# Patient Record
Sex: Male | Born: 1990 | Race: White | Hispanic: No | Marital: Single | Smoking: Current every day smoker
Health system: Southern US, Community
[De-identification: ages and names within clinical notes are randomized; demographics above are authoritative.]

## PROBLEM LIST (undated history)

## (undated) DIAGNOSIS — E785 Hyperlipidemia, unspecified: Secondary | ICD-10-CM

## (undated) DIAGNOSIS — M549 Dorsalgia, unspecified: Secondary | ICD-10-CM

## (undated) DIAGNOSIS — N201 Calculus of ureter: Secondary | ICD-10-CM

## (undated) HISTORY — PX: SHOULDER SURGERY: SHX246

## (undated) HISTORY — PX: TONSILLECTOMY: SUR1361

## (undated) HISTORY — PX: ADENOIDECTOMY: SUR15

---

## 2009-01-13 ENCOUNTER — Emergency Department (HOSPITAL_BASED_OUTPATIENT_CLINIC_OR_DEPARTMENT_OTHER): Admission: EM | Admit: 2009-01-13 | Discharge: 2009-01-14 | Payer: Self-pay | Admitting: Emergency Medicine

## 2009-01-26 ENCOUNTER — Emergency Department (HOSPITAL_BASED_OUTPATIENT_CLINIC_OR_DEPARTMENT_OTHER): Admission: EM | Admit: 2009-01-26 | Discharge: 2009-01-26 | Payer: Self-pay | Admitting: Emergency Medicine

## 2009-02-05 ENCOUNTER — Emergency Department (HOSPITAL_BASED_OUTPATIENT_CLINIC_OR_DEPARTMENT_OTHER): Admission: EM | Admit: 2009-02-05 | Discharge: 2009-02-05 | Payer: Self-pay | Admitting: Emergency Medicine

## 2009-02-10 ENCOUNTER — Emergency Department (HOSPITAL_BASED_OUTPATIENT_CLINIC_OR_DEPARTMENT_OTHER): Admission: EM | Admit: 2009-02-10 | Discharge: 2009-02-10 | Payer: Self-pay | Admitting: Emergency Medicine

## 2009-02-25 ENCOUNTER — Emergency Department (HOSPITAL_BASED_OUTPATIENT_CLINIC_OR_DEPARTMENT_OTHER): Admission: EM | Admit: 2009-02-25 | Discharge: 2009-02-25 | Payer: Self-pay | Admitting: Emergency Medicine

## 2009-03-24 ENCOUNTER — Emergency Department (HOSPITAL_BASED_OUTPATIENT_CLINIC_OR_DEPARTMENT_OTHER): Admission: EM | Admit: 2009-03-24 | Discharge: 2009-03-24 | Payer: Self-pay | Admitting: Emergency Medicine

## 2009-08-13 ENCOUNTER — Emergency Department (HOSPITAL_BASED_OUTPATIENT_CLINIC_OR_DEPARTMENT_OTHER): Admission: EM | Admit: 2009-08-13 | Discharge: 2009-08-14 | Payer: Self-pay | Admitting: Emergency Medicine

## 2009-08-14 ENCOUNTER — Ambulatory Visit: Payer: Self-pay | Admitting: Diagnostic Radiology

## 2009-09-08 ENCOUNTER — Emergency Department (HOSPITAL_BASED_OUTPATIENT_CLINIC_OR_DEPARTMENT_OTHER): Admission: EM | Admit: 2009-09-08 | Discharge: 2009-09-08 | Payer: Self-pay | Admitting: Emergency Medicine

## 2009-09-08 ENCOUNTER — Ambulatory Visit: Payer: Self-pay | Admitting: Diagnostic Radiology

## 2009-09-18 ENCOUNTER — Ambulatory Visit: Payer: Self-pay | Admitting: Diagnostic Radiology

## 2009-09-18 ENCOUNTER — Emergency Department (HOSPITAL_BASED_OUTPATIENT_CLINIC_OR_DEPARTMENT_OTHER): Admission: EM | Admit: 2009-09-18 | Discharge: 2009-09-18 | Payer: Self-pay | Admitting: Emergency Medicine

## 2009-12-04 ENCOUNTER — Ambulatory Visit: Payer: Self-pay | Admitting: Radiology

## 2009-12-04 ENCOUNTER — Emergency Department (HOSPITAL_BASED_OUTPATIENT_CLINIC_OR_DEPARTMENT_OTHER): Admission: EM | Admit: 2009-12-04 | Discharge: 2009-12-04 | Payer: Self-pay | Admitting: Emergency Medicine

## 2009-12-12 ENCOUNTER — Emergency Department (HOSPITAL_BASED_OUTPATIENT_CLINIC_OR_DEPARTMENT_OTHER): Admission: EM | Admit: 2009-12-12 | Discharge: 2009-12-12 | Payer: Self-pay | Admitting: Emergency Medicine

## 2009-12-12 ENCOUNTER — Ambulatory Visit: Payer: Self-pay | Admitting: Diagnostic Radiology

## 2010-01-02 ENCOUNTER — Emergency Department (HOSPITAL_BASED_OUTPATIENT_CLINIC_OR_DEPARTMENT_OTHER): Admission: EM | Admit: 2010-01-02 | Discharge: 2010-01-02 | Payer: Self-pay | Admitting: Emergency Medicine

## 2010-03-06 ENCOUNTER — Emergency Department (HOSPITAL_BASED_OUTPATIENT_CLINIC_OR_DEPARTMENT_OTHER): Admission: EM | Admit: 2010-03-06 | Discharge: 2010-03-06 | Payer: Self-pay | Admitting: Emergency Medicine

## 2010-07-16 ENCOUNTER — Emergency Department (HOSPITAL_BASED_OUTPATIENT_CLINIC_OR_DEPARTMENT_OTHER): Admission: EM | Admit: 2010-07-16 | Discharge: 2010-07-16 | Payer: Self-pay | Admitting: Emergency Medicine

## 2010-10-25 ENCOUNTER — Emergency Department (HOSPITAL_BASED_OUTPATIENT_CLINIC_OR_DEPARTMENT_OTHER)
Admission: EM | Admit: 2010-10-25 | Discharge: 2010-10-25 | Disposition: A | Discharge status: Home / Self Care | Payer: Self-pay | Attending: Emergency Medicine | Admitting: Emergency Medicine

## 2010-10-25 ENCOUNTER — Emergency Department (INDEPENDENT_AMBULATORY_CARE_PROVIDER_SITE_OTHER): Payer: Self-pay

## 2010-10-25 DIAGNOSIS — E669 Obesity, unspecified: Secondary | ICD-10-CM | POA: Insufficient documentation

## 2010-10-25 DIAGNOSIS — R109 Unspecified abdominal pain: Secondary | ICD-10-CM

## 2010-10-25 DIAGNOSIS — R10813 Right lower quadrant abdominal tenderness: Secondary | ICD-10-CM | POA: Insufficient documentation

## 2010-10-25 DIAGNOSIS — K089 Disorder of teeth and supporting structures, unspecified: Secondary | ICD-10-CM | POA: Insufficient documentation

## 2010-10-25 DIAGNOSIS — Z87442 Personal history of urinary calculi: Secondary | ICD-10-CM

## 2010-10-25 DIAGNOSIS — K029 Dental caries, unspecified: Secondary | ICD-10-CM | POA: Insufficient documentation

## 2010-10-25 DIAGNOSIS — E789 Disorder of lipoprotein metabolism, unspecified: Secondary | ICD-10-CM | POA: Insufficient documentation

## 2010-10-25 DIAGNOSIS — M549 Dorsalgia, unspecified: Secondary | ICD-10-CM | POA: Insufficient documentation

## 2010-10-25 LAB — URINALYSIS, ROUTINE W REFLEX MICROSCOPIC
Hgb urine dipstick: NEGATIVE
Ketones, ur: NEGATIVE mg/dL
Nitrite: NEGATIVE
Urine Glucose, Fasting: NEGATIVE mg/dL
pH: 8.5 — ABNORMAL HIGH (ref 5.0–8.0)

## 2010-11-15 LAB — URINALYSIS, ROUTINE W REFLEX MICROSCOPIC
Glucose, UA: NEGATIVE mg/dL
Glucose, UA: NEGATIVE mg/dL
Ketones, ur: NEGATIVE mg/dL
Leukocytes, UA: NEGATIVE
Leukocytes, UA: NEGATIVE
Nitrite: NEGATIVE
Protein, ur: 100 mg/dL — AB
Protein, ur: NEGATIVE mg/dL
Specific Gravity, Urine: 1.027 (ref 1.005–1.030)
Urobilinogen, UA: 1 mg/dL (ref 0.0–1.0)
pH: 7.5 (ref 5.0–8.0)

## 2010-11-15 LAB — URINE MICROSCOPIC-ADD ON

## 2010-11-16 LAB — URINE MICROSCOPIC-ADD ON

## 2010-11-16 LAB — URINALYSIS, ROUTINE W REFLEX MICROSCOPIC
Glucose, UA: NEGATIVE mg/dL
Leukocytes, UA: NEGATIVE
Specific Gravity, Urine: 1.013 (ref 1.005–1.030)
Urobilinogen, UA: 0.2 mg/dL (ref 0.0–1.0)

## 2010-11-16 LAB — URINE CULTURE: Colony Count: NO GROWTH

## 2010-11-21 ENCOUNTER — Emergency Department (INDEPENDENT_AMBULATORY_CARE_PROVIDER_SITE_OTHER): Payer: Self-pay

## 2010-11-21 ENCOUNTER — Emergency Department (HOSPITAL_BASED_OUTPATIENT_CLINIC_OR_DEPARTMENT_OTHER)
Admission: EM | Admit: 2010-11-21 | Discharge: 2010-11-22 | Disposition: A | Discharge status: Home / Self Care | Payer: Self-pay | Attending: Emergency Medicine | Admitting: Emergency Medicine

## 2010-11-21 DIAGNOSIS — M545 Low back pain, unspecified: Secondary | ICD-10-CM | POA: Insufficient documentation

## 2010-11-21 DIAGNOSIS — F17200 Nicotine dependence, unspecified, uncomplicated: Secondary | ICD-10-CM | POA: Insufficient documentation

## 2010-11-21 DIAGNOSIS — E7800 Pure hypercholesterolemia, unspecified: Secondary | ICD-10-CM | POA: Insufficient documentation

## 2010-11-21 DIAGNOSIS — M549 Dorsalgia, unspecified: Secondary | ICD-10-CM

## 2010-11-21 DIAGNOSIS — W1789XA Other fall from one level to another, initial encounter: Secondary | ICD-10-CM

## 2010-11-21 DIAGNOSIS — G8929 Other chronic pain: Secondary | ICD-10-CM | POA: Insufficient documentation

## 2010-11-28 LAB — CBC
HCT: 43 % (ref 39.0–52.0)
Hemoglobin: 15 g/dL (ref 13.0–17.0)
MCHC: 34.8 g/dL (ref 30.0–36.0)
RBC: 5.18 MIL/uL (ref 4.22–5.81)

## 2010-11-28 LAB — DIFFERENTIAL
Basophils Absolute: 0.1 10*3/uL (ref 0.0–0.1)
Basophils Relative: 1 % (ref 0–1)
Eosinophils Relative: 3 % (ref 0–5)
Monocytes Absolute: 0.8 10*3/uL (ref 0.1–1.0)
Monocytes Relative: 7 % (ref 3–12)

## 2010-11-28 LAB — COMPREHENSIVE METABOLIC PANEL
ALT: 37 U/L (ref 0–53)
Alkaline Phosphatase: 115 U/L (ref 39–117)
Chloride: 106 mEq/L (ref 96–112)
GFR calc Af Amer: >60 mL/min (ref >60)
GFR calc non Af Amer: >60 mL/min (ref >60)
Glucose, Bld: 86 mg/dL (ref 70–99)
Potassium: 3.9 mEq/L (ref 3.5–5.1)

## 2010-12-05 LAB — URINALYSIS, ROUTINE W REFLEX MICROSCOPIC
Nitrite: NEGATIVE
Protein, ur: NEGATIVE mg/dL
Specific Gravity, Urine: 1.017 (ref 1.005–1.030)
Urobilinogen, UA: 0.2 mg/dL (ref 0.0–1.0)
pH: 8.5 — ABNORMAL HIGH (ref 5.0–8.0)

## 2010-12-22 ENCOUNTER — Emergency Department (HOSPITAL_BASED_OUTPATIENT_CLINIC_OR_DEPARTMENT_OTHER)
Admission: EM | Admit: 2010-12-22 | Discharge: 2010-12-22 | Disposition: A | Discharge status: Home / Self Care | Payer: Self-pay | Attending: Emergency Medicine | Admitting: Emergency Medicine

## 2010-12-22 DIAGNOSIS — G8929 Other chronic pain: Secondary | ICD-10-CM | POA: Insufficient documentation

## 2010-12-22 DIAGNOSIS — W010XXA Fall on same level from slipping, tripping and stumbling without subsequent striking against object, initial encounter: Secondary | ICD-10-CM | POA: Insufficient documentation

## 2010-12-22 DIAGNOSIS — R112 Nausea with vomiting, unspecified: Secondary | ICD-10-CM | POA: Insufficient documentation

## 2010-12-22 DIAGNOSIS — E7800 Pure hypercholesterolemia, unspecified: Secondary | ICD-10-CM | POA: Insufficient documentation

## 2010-12-22 DIAGNOSIS — F17200 Nicotine dependence, unspecified, uncomplicated: Secondary | ICD-10-CM | POA: Insufficient documentation

## 2010-12-22 DIAGNOSIS — M549 Dorsalgia, unspecified: Secondary | ICD-10-CM | POA: Insufficient documentation

## 2010-12-22 DIAGNOSIS — Y92009 Unspecified place in unspecified non-institutional (private) residence as the place of occurrence of the external cause: Secondary | ICD-10-CM | POA: Insufficient documentation

## 2010-12-22 DIAGNOSIS — Y93H2 Activity, gardening and landscaping: Secondary | ICD-10-CM | POA: Insufficient documentation

## 2010-12-22 LAB — URINE MICROSCOPIC-ADD ON

## 2010-12-22 LAB — URINALYSIS, ROUTINE W REFLEX MICROSCOPIC
Glucose, UA: NEGATIVE mg/dL
Ketones, ur: NEGATIVE mg/dL
Leukocytes, UA: NEGATIVE
Nitrite: NEGATIVE
Protein, ur: NEGATIVE mg/dL
Urobilinogen, UA: 1 mg/dL (ref 0.0–1.0)

## 2011-01-24 ENCOUNTER — Emergency Department (HOSPITAL_BASED_OUTPATIENT_CLINIC_OR_DEPARTMENT_OTHER)
Admission: EM | Admit: 2011-01-24 | Discharge: 2011-01-24 | Disposition: A | Discharge status: Home / Self Care | Payer: Self-pay | Attending: Emergency Medicine | Admitting: Emergency Medicine

## 2011-01-24 DIAGNOSIS — G8929 Other chronic pain: Secondary | ICD-10-CM | POA: Insufficient documentation

## 2011-01-24 DIAGNOSIS — K089 Disorder of teeth and supporting structures, unspecified: Secondary | ICD-10-CM | POA: Insufficient documentation

## 2011-01-24 DIAGNOSIS — E7800 Pure hypercholesterolemia, unspecified: Secondary | ICD-10-CM | POA: Insufficient documentation

## 2011-01-24 DIAGNOSIS — M549 Dorsalgia, unspecified: Secondary | ICD-10-CM | POA: Insufficient documentation

## 2011-02-21 ENCOUNTER — Emergency Department (HOSPITAL_BASED_OUTPATIENT_CLINIC_OR_DEPARTMENT_OTHER)
Admission: EM | Admit: 2011-02-21 | Discharge: 2011-02-21 | Disposition: A | Discharge status: Home / Self Care | Payer: Self-pay | Attending: Emergency Medicine | Admitting: Emergency Medicine

## 2011-02-21 DIAGNOSIS — E7800 Pure hypercholesterolemia, unspecified: Secondary | ICD-10-CM | POA: Insufficient documentation

## 2011-02-21 DIAGNOSIS — K089 Disorder of teeth and supporting structures, unspecified: Secondary | ICD-10-CM | POA: Insufficient documentation

## 2011-02-21 DIAGNOSIS — G8929 Other chronic pain: Secondary | ICD-10-CM | POA: Insufficient documentation

## 2011-05-12 ENCOUNTER — Encounter: Payer: Self-pay | Admitting: *Deleted

## 2011-05-12 ENCOUNTER — Emergency Department (INDEPENDENT_AMBULATORY_CARE_PROVIDER_SITE_OTHER): Payer: Self-pay

## 2011-05-12 ENCOUNTER — Emergency Department (HOSPITAL_BASED_OUTPATIENT_CLINIC_OR_DEPARTMENT_OTHER)
Admission: EM | Admit: 2011-05-12 | Discharge: 2011-05-12 | Disposition: A | Discharge status: Home / Self Care | Payer: Self-pay | Attending: Emergency Medicine | Admitting: Emergency Medicine

## 2011-05-12 DIAGNOSIS — M549 Dorsalgia, unspecified: Secondary | ICD-10-CM | POA: Insufficient documentation

## 2011-05-12 DIAGNOSIS — N289 Disorder of kidney and ureter, unspecified: Secondary | ICD-10-CM

## 2011-05-12 DIAGNOSIS — R109 Unspecified abdominal pain: Secondary | ICD-10-CM

## 2011-05-12 LAB — URINALYSIS, ROUTINE W REFLEX MICROSCOPIC
Glucose, UA: NEGATIVE mg/dL
Hgb urine dipstick: NEGATIVE
Ketones, ur: NEGATIVE mg/dL
Leukocytes, UA: NEGATIVE
Protein, ur: NEGATIVE mg/dL
Urobilinogen, UA: 0.2 mg/dL (ref 0.0–1.0)

## 2011-05-12 LAB — CBC
HCT: 41 % (ref 39.0–52.0)
MCHC: 34.6 g/dL (ref 30.0–36.0)
MCV: 81.8 fL (ref 78.0–100.0)
Platelets: 390 10*3/uL (ref 150–400)
RDW: 13.3 % (ref 11.5–15.5)

## 2011-05-12 LAB — DIFFERENTIAL
Basophils Absolute: 0.1 10*3/uL (ref 0.0–0.1)
Basophils Relative: 1 % (ref 0–1)
Eosinophils Relative: 3 % (ref 0–5)
Monocytes Absolute: 0.8 10*3/uL (ref 0.1–1.0)

## 2011-05-12 LAB — COMPREHENSIVE METABOLIC PANEL
AST: 32 U/L (ref 0–37)
Albumin: 4.3 g/dL (ref 3.5–5.2)
Calcium: 9.6 mg/dL (ref 8.4–10.5)
Creatinine, Ser: 0.7 mg/dL (ref 0.50–1.35)
GFR calc non Af Amer: >60 mL/min (ref >60)
Total Protein: 7.3 g/dL (ref 6.0–8.3)

## 2011-05-12 MED ORDER — HYDROMORPHONE HCL 1 MG/ML IJ SOLN
1.0000 mg | Freq: Once | INTRAMUSCULAR | Status: AC
  Administered 2011-05-12: 1 mg via INTRAVENOUS
  Filled 2011-05-12: qty 1

## 2011-05-12 MED ORDER — HYDROCODONE-ACETAMINOPHEN 5-325 MG PO TABS: 2.0000 | ORAL_TABLET | ORAL | Status: AC | PRN

## 2011-05-12 MED ORDER — ONDANSETRON HCL 4 MG/2ML IJ SOLN
4.0000 mg | Freq: Once | INTRAMUSCULAR | Status: AC
Start: 2011-05-12 — End: 2011-05-12
  Administered 2011-05-12: 4 mg via INTRAVENOUS
  Filled 2011-05-12: qty 2

## 2011-05-12 MED ORDER — IBUPROFEN 600 MG PO TABS: 600.0000 mg | ORAL_TABLET | Freq: Four times a day (QID) | ORAL | Status: AC | PRN

## 2011-05-12 NOTE — ED Notes (Signed)
Pt awaiting ride home.

## 2011-05-12 NOTE — ED Notes (Addendum)
Pt c/o left side flank pain x3 days. Pt has hx of kidney stones and sts this feels the same. Pt took one of his mother's oxycodone yesterday which helped the pain.

## 2011-05-12 NOTE — ED Provider Notes (Signed)
History     CSN: [id number] Arrival date & time: 05/12/2011 10:03 AM   Chief Complaint  Patient presents with  . Nephrolithiasis     (Include location/radiation/quality/duration/timing/severity/associated sxs/prior treatment) Patient is a 20 y.o. male presenting with flank pain.  Flank Pain This is a new problem. The current episode started in the past 7 days. The problem occurs constantly. The problem has been gradually worsening. Associated symptoms include abdominal pain. The symptoms are aggravated by nothing. He has tried nothing for the symptoms. The treatment provided no relief.   Pt complains of pain in left back.  Pt reports this feels like previous kidney stones.  Past Medical History  Diagnosis Date  . Renal disorder      Past Surgical History  Procedure Date  . Tonsillectomy   . Shoulder surgery     No family history on file.  History  Substance Use Topics  . Smoking status: Former Games developer  . Smokeless tobacco: Not on file  . Alcohol Use: No      Review of Systems  Gastrointestinal: Positive for abdominal pain.  Genitourinary: Positive for flank pain.  Musculoskeletal: Positive for back pain.  All other systems reviewed and are negative.    Allergies  Toradol  Home Medications  No current outpatient prescriptions on file.  Physical Exam    BP 135/83  Pulse 95  Temp(Src) 98.2 F (36.8 C) (Oral)  Resp 20  SpO2 98%  Physical Exam  Nursing note and vitals reviewed. Constitutional: He appears well-developed and well-nourished.  HENT:  Head: Normocephalic.  Eyes: Conjunctivae are normal. Pupils are equal, round, and reactive to light.  Neck: Normal range of motion. Neck supple.  Cardiovascular: Normal rate.   Pulmonary/Chest: Effort normal.  Abdominal: Soft. There is tenderness. There is guarding.  Musculoskeletal: Normal range of motion.  Neurological: He is alert.  Skin: Skin is warm.  Psychiatric: He has a normal mood and affect.    Pt given IV dilaudid and zofran.   Ct shows no evidence of stone.  I will treat back pain. ED Course  Procedures  Results for orders placed during the hospital encounter of 05/12/11  URINALYSIS, ROUTINE W REFLEX MICROSCOPIC      Component Value Range   Color, Urine YELLOW  YELLOW    Appearance CLEAR  CLEAR    Specific Gravity, Urine 1.011  1.005 - 1.030    pH 8.0  5.0 - 8.0    Glucose, UA NEGATIVE  NEGATIVE (mg/dL)   Hgb urine dipstick NEGATIVE  NEGATIVE    Bilirubin Urine NEGATIVE  NEGATIVE    Ketones, ur NEGATIVE  NEGATIVE (mg/dL)   Protein, ur NEGATIVE  NEGATIVE (mg/dL)   Urobilinogen, UA 0.2  0.0 - 1.0 (mg/dL)   Nitrite NEGATIVE  NEGATIVE    Leukocytes, UA NEGATIVE  NEGATIVE   CBC      Component Value Range   WBC 9.3  4.0 - 10.5 (K/uL)   RBC 5.01  4.22 - 5.81 (MIL/uL)   Hemoglobin 14.2  13.0 - 17.0 (g/dL)   HCT 78.4  69.6 - 29.5 (%)   MCV 81.8  78.0 - 100.0 (fL)   MCH 28.3  26.0 - 34.0 (pg)   MCHC 34.6  30.0 - 36.0 (g/dL)   RDW 28.4  13.2 - 44.0 (%)   Platelets 390  150 - 400 (K/uL)  DIFFERENTIAL      Component Value Range   Neutrophils Relative 58  43 - 77 (%)   Neutro Abs  5.3  1.7 - 7.7 (K/uL)   Lymphocytes Relative 31  12 - 46 (%)   Lymphs Abs 2.9  0.7 - 4.0 (K/uL)   Monocytes Relative 8  3 - 12 (%)   Monocytes Absolute 0.8  0.1 - 1.0 (K/uL)   Eosinophils Relative 3  0 - 5 (%)   Eosinophils Absolute 0.2  0.0 - 0.7 (K/uL)   Basophils Relative 1  0 - 1 (%)   Basophils Absolute 0.1  0.0 - 0.1 (K/uL)  COMPREHENSIVE METABOLIC PANEL      Component Value Range   Sodium 140  135 - 145 (mEq/L)   Potassium 3.9  3.5 - 5.1 (mEq/L)   Chloride 103  96 - 112 (mEq/L)   CO2 26  19 - 32 (mEq/L)   Glucose, Bld 96  70 - 99 (mg/dL)   BUN 8  6 - 23 (mg/dL)   Creatinine, Ser 2.13  0.50 - 1.35 (mg/dL)   Calcium 9.6  8.4 - 08.6 (mg/dL)   Total Protein 7.3  6.0 - 8.3 (g/dL)   Albumin 4.3  3.5 - 5.2 (g/dL)   AST 32  0 - 37 (U/L)   ALT 46  0 - 53 (U/L)   Alkaline Phosphatase 97   39 - 117 (U/L)   Total Bilirubin 0.4  0.3 - 1.2 (mg/dL)   GFR calc non Af Amer >60  >60 (mL/min)   GFR calc Af Amer >60  >60 (mL/min)   No results found.   No diagnosis found.   MDM Pt advised to follow up primary care.       Langston Masker, [place] 05/12/11 1335  Langston Masker, [place] 05/12/11 1336

## 2011-05-15 NOTE — ED Provider Notes (Signed)
Medical screening examination/treatment/procedure(s) were performed by non-physician practitioner and as supervising physician I was immediately available for consultation/collaboration.  Nicholes Stairs, MD 05/15/11 [phone]

## 2011-07-02 ENCOUNTER — Emergency Department (HOSPITAL_BASED_OUTPATIENT_CLINIC_OR_DEPARTMENT_OTHER)
Admission: EM | Admit: 2011-07-02 | Discharge: 2011-07-02 | Disposition: A | Discharge status: Home / Self Care | Payer: Self-pay | Attending: Emergency Medicine | Admitting: Emergency Medicine

## 2011-07-02 ENCOUNTER — Encounter (HOSPITAL_BASED_OUTPATIENT_CLINIC_OR_DEPARTMENT_OTHER): Payer: Self-pay | Admitting: *Deleted

## 2011-07-02 ENCOUNTER — Emergency Department (INDEPENDENT_AMBULATORY_CARE_PROVIDER_SITE_OTHER): Payer: Self-pay

## 2011-07-02 DIAGNOSIS — K089 Disorder of teeth and supporting structures, unspecified: Secondary | ICD-10-CM | POA: Insufficient documentation

## 2011-07-02 DIAGNOSIS — K0889 Other specified disorders of teeth and supporting structures: Secondary | ICD-10-CM

## 2011-07-02 DIAGNOSIS — W19XXXA Unspecified fall, initial encounter: Secondary | ICD-10-CM

## 2011-07-02 DIAGNOSIS — M549 Dorsalgia, unspecified: Secondary | ICD-10-CM | POA: Insufficient documentation

## 2011-07-02 DIAGNOSIS — M545 Low back pain: Secondary | ICD-10-CM

## 2011-07-02 LAB — URINALYSIS, ROUTINE W REFLEX MICROSCOPIC
Glucose, UA: NEGATIVE mg/dL
Leukocytes, UA: NEGATIVE
Protein, ur: NEGATIVE mg/dL
Specific Gravity, Urine: 1.025 (ref 1.005–1.030)
pH: 7.5 (ref 5.0–8.0)

## 2011-07-02 LAB — URINE MICROSCOPIC-ADD ON

## 2011-07-02 MED ORDER — HYDROCODONE-ACETAMINOPHEN 5-325 MG PO TABS
1.0000 | ORAL_TABLET | Freq: Once | ORAL | Status: AC
  Administered 2011-07-02: 1 via ORAL
  Filled 2011-07-02: qty 1

## 2011-07-02 MED ORDER — HYDROCODONE-ACETAMINOPHEN 5-325 MG PO TABS: 1.0000 | ORAL_TABLET | ORAL | Status: AC | PRN

## 2011-07-02 NOTE — ED Notes (Signed)
Care plan and follow up reviewed with pt verbalizes plan well

## 2011-07-02 NOTE — ED Notes (Signed)
Pt states he broke his tooth and injured his back on Halloween.

## 2011-07-02 NOTE — ED Notes (Signed)
MD at bedside.Care plan reviewed with pt

## 2011-07-02 NOTE — ED Provider Notes (Signed)
History  Scribed for Carleene Cooper III, MD, the patient was seen in [facility]. The chart was scribed by Gilman Schmidt. The patients care was started at 5:56 PM.   CSN: [id number] Arrival date & time: 07/02/2011  4:42 PM   First MD Initiated Contact with Patient 07/02/11 1750      Chief Complaint  Patient presents with  . Back Pain    HPI Andrew George is a 20 y.o. male who presents to the Emergency Department complaining of back pain. Pt reports falling off skate board four days ago, landing on back with skateboard hitting him in mouth causing him to break tooth. Reports tingling and numbness in feet, and urine incontinence (1x yesterday and 1x today), and pain in mouth. Pt has not taken an OTC meds for pain. Denies any fever, sore throat, ear ache, visual changes, V/D/N, rash, seizure, syncope, or feeling faint. Pt notes he has not been able to sleep due to pain. There are no other associated symptoms and no other alleviating or aggravating factors.    Past Medical History  Diagnosis Date  . Renal disorder     Past Surgical History  Procedure Date  . Tonsillectomy   . Shoulder surgery     History reviewed. No pertinent family history.  History  Substance Use Topics  . Smoking status: Former Games developer  . Smokeless tobacco: Not on file  . Alcohol Use: No      Review of Systems  Constitutional: Negative for fever.  HENT: Negative for ear pain and sore throat.   Eyes: Negative for photophobia and visual disturbance.  Gastrointestinal: Negative for nausea, vomiting and diarrhea.  Neurological: Negative for seizures.  All other systems reviewed and are negative.    Allergies  Toradol  Home Medications  No current outpatient prescriptions on file.  BP 150/96  Pulse 90  Temp(Src) 98 F (36.7 C) (Oral)  Resp 20  Ht 6\' 1"  (1.854 m)  Wt 290 lb (131.543 kg)  BMI 38.26 kg/m2  SpO2 98%  Physical Exam  Constitutional: He is oriented to person, place, and time. He appears  well-developed and well-nourished.  HENT:  Head: Normocephalic and atraumatic.  Right Ear: External ear normal.  Left Ear: External ear normal.       1st molar on left upper broken off  Eyes: Conjunctivae and EOM are normal. Pupils are equal, round, and reactive to light.  Neck: Normal range of motion and phonation normal. Neck supple.  Cardiovascular: Normal rate, regular rhythm, normal heart sounds and intact distal pulses.   Pulmonary/Chest: Effort normal and breath sounds normal. He exhibits no bony tenderness.  Abdominal: Soft. Normal appearance and bowel sounds are normal. There is no tenderness.  Musculoskeletal: Normal range of motion.       Lower lumbar region back tenderness  Neurological: He is alert and oriented to person, place, and time. He has normal strength. No cranial nerve deficit or sensory deficit. He exhibits normal muscle tone. Coordination normal.  Skin: Skin is warm, dry and intact.  Psychiatric: He has a normal mood and affect. His behavior is normal. Judgment and thought content normal.    ED Course  Procedures DIAGNOSTIC STUDIES: COORDINATION OF CARE: 5:56PM:  - Patient evaluated by ED physician, DG Lumbar, UA, Occult blood card to lab ordered   Results for orders placed during the hospital encounter of 07/02/11  OCCULT BLOOD X 1 CARD TO LAB, STOOL      Component Value Range   Fecal Occult  Bld NEGATIVE       RADIOLOGY: DG Lumbar Spine 4 View. Reviewed by me. IMPRESSION: No acute bony abnormality. Original Report Authenticated By: Cyndie Chime, M.D.  7:09 PM X-rays the lumbar spine were negative. Patient can take hydrocodone acetaminophen every 4 hours if needed for pain. He will need to see a dentist to check on his broken tooth.  I personally performed the services described in this documentation, which was scribed in my presence. The recorded information has been reviewed and considered.  Osvaldo Human, M.D.     Carleene Cooper III,  MD 07/02/11 [phone]

## 2011-08-15 ENCOUNTER — Emergency Department (HOSPITAL_BASED_OUTPATIENT_CLINIC_OR_DEPARTMENT_OTHER)
Admission: EM | Admit: 2011-08-15 | Discharge: 2011-08-15 | Disposition: A | Discharge status: Home / Self Care | Payer: Self-pay | Attending: Emergency Medicine | Admitting: Emergency Medicine

## 2011-08-15 ENCOUNTER — Emergency Department (INDEPENDENT_AMBULATORY_CARE_PROVIDER_SITE_OTHER): Payer: Self-pay

## 2011-08-15 ENCOUNTER — Encounter (HOSPITAL_BASED_OUTPATIENT_CLINIC_OR_DEPARTMENT_OTHER): Payer: Self-pay | Admitting: Emergency Medicine

## 2011-08-15 DIAGNOSIS — E785 Hyperlipidemia, unspecified: Secondary | ICD-10-CM | POA: Insufficient documentation

## 2011-08-15 DIAGNOSIS — R52 Pain, unspecified: Secondary | ICD-10-CM

## 2011-08-15 DIAGNOSIS — J3489 Other specified disorders of nose and nasal sinuses: Secondary | ICD-10-CM | POA: Insufficient documentation

## 2011-08-15 DIAGNOSIS — J40 Bronchitis, not specified as acute or chronic: Secondary | ICD-10-CM | POA: Insufficient documentation

## 2011-08-15 DIAGNOSIS — J029 Acute pharyngitis, unspecified: Secondary | ICD-10-CM

## 2011-08-15 DIAGNOSIS — R05 Cough: Secondary | ICD-10-CM

## 2011-08-15 DIAGNOSIS — R0989 Other specified symptoms and signs involving the circulatory and respiratory systems: Secondary | ICD-10-CM

## 2011-08-15 HISTORY — DX: Hyperlipidemia, unspecified: E78.5

## 2011-08-15 MED ORDER — HYDROCODONE-ACETAMINOPHEN 5-325 MG PO TABS: 2.0000 | ORAL_TABLET | ORAL | Status: AC | PRN

## 2011-08-15 MED ORDER — AZITHROMYCIN 250 MG PO TABS: 250.0000 mg | ORAL_TABLET | Freq: Every day | ORAL | Status: AC

## 2011-08-15 NOTE — ED Notes (Signed)
Patient transported to X-ray 

## 2011-08-15 NOTE — ED Notes (Signed)
Pt reports onset of generalized body aches, fever, cough and cold symptoms that started Friday.

## 2011-08-15 NOTE — ED Provider Notes (Signed)
History     CSN: [id number] Arrival date & time: 08/15/2011  2:24 PM   First MD Initiated Contact with Patient 08/15/11 1419      Chief Complaint  Patient presents with  . Cough  . Nasal Congestion  . Fever  . Pleurisy    (Consider location/radiation/quality/duration/timing/severity/associated sxs/prior treatment) Patient is a 20 y.o. male presenting with cough and fever. The history is provided by the patient. No language interpreter was used.  Cough This is a new problem. The current episode started more than 2 days ago. The problem occurs constantly. The problem has been rapidly worsening. The cough is productive of sputum. The fever has been present for 3 to 4 days. Associated symptoms include chest pain, ear congestion, headaches, rhinorrhea and myalgias. He has tried decongestants for the symptoms. The treatment provided no relief. Risk factors: child with cough. His past medical history is significant for COPD. His past medical history does not include bronchitis, pneumonia or asthma.  Fever Primary symptoms of the febrile illness include fever, headaches, cough and myalgias.    Past Medical History  Diagnosis Date  . Renal disorder   . Hyperlipemia     Past Surgical History  Procedure Date  . Tonsillectomy   . Shoulder surgery   . Adenoidectomy     History reviewed. No pertinent family history.  History  Substance Use Topics  . Smoking status: Former Games developer  . Smokeless tobacco: Not on file  . Alcohol Use: No      Review of Systems  Constitutional: Positive for fever.  HENT: Positive for rhinorrhea.   Respiratory: Positive for cough.   Cardiovascular: Positive for chest pain.  Musculoskeletal: Positive for myalgias.  Neurological: Positive for headaches.  All other systems reviewed and are negative.    Allergies  Toradol  Home Medications  No current outpatient prescriptions on file.  BP 126/86  Pulse 88  Temp(Src) 98.3 F (36.8 C) (Oral)   Resp 22  SpO2 100%  Physical Exam  Vitals reviewed. Constitutional: He appears well-developed and well-nourished.  HENT:  Head: Normocephalic and atraumatic.  Right Ear: External ear normal.  Left Ear: External ear normal.  Nose: Nose normal.  Mouth/Throat: Oropharynx is clear and moist.  Eyes: Conjunctivae and EOM are normal. Pupils are equal, round, and reactive to light.  Neck: Normal range of motion. Neck supple.  Cardiovascular: Normal rate and normal heart sounds.   Pulmonary/Chest: Effort normal and breath sounds normal.  Abdominal: Soft.  Musculoskeletal: Normal range of motion.  Neurological: He is alert.  Skin: Skin is warm.  Psychiatric: He has a normal mood and affect.    ED Course  Procedures (including critical care time)  Labs Reviewed - No data to display Dg Chest 2 View  08/15/2011  *RADIOLOGY REPORT*  Clinical Data: Cough, body aches, congestion, sore throat, fever  CHEST - 2 VIEW  Comparison: 08/14/2009  Findings: Normal heart size, mediastinal contours, and pulmonary vascularity. Peribronchial thickening, unchanged. No pulmonary infiltrate, pleural effusion or pneumothorax. Bones unremarkable.  IMPRESSION: Chronic bronchitic changes.  Original Report Authenticated By: Lollie Marrow, M.D.     No diagnosis found.    MDM  I will treat with zithromax and hydrocodone for pain        Langston Masker, [place] 08/15/11 1558

## 2011-08-15 NOTE — ED Notes (Signed)
Pt states he has runny nose, congestion, sore throat, productive cough, thick yellow brown sputum.  Generalized aches and pains.  Chest soreness from coughing.  Pain increases with palpation.  Some N/V.  No diarrhea.  Family sick with similar.  No resp distress noted.

## 2011-08-16 NOTE — ED Provider Notes (Signed)
Medical screening examination/treatment/procedure(s) were performed by non-physician practitioner and as supervising physician I was immediately available for consultation/collaboration.   Lyanne Co, MD 08/16/11 [phone]

## 2011-09-06 ENCOUNTER — Emergency Department (HOSPITAL_BASED_OUTPATIENT_CLINIC_OR_DEPARTMENT_OTHER)
Admission: EM | Admit: 2011-09-06 | Discharge: 2011-09-06 | Disposition: A | Discharge status: Home / Self Care | Payer: Self-pay | Attending: Emergency Medicine | Admitting: Emergency Medicine

## 2011-09-06 ENCOUNTER — Encounter (HOSPITAL_BASED_OUTPATIENT_CLINIC_OR_DEPARTMENT_OTHER): Payer: Self-pay

## 2011-09-06 DIAGNOSIS — R05 Cough: Secondary | ICD-10-CM | POA: Insufficient documentation

## 2011-09-06 DIAGNOSIS — E785 Hyperlipidemia, unspecified: Secondary | ICD-10-CM | POA: Insufficient documentation

## 2011-09-06 DIAGNOSIS — R059 Cough, unspecified: Secondary | ICD-10-CM | POA: Insufficient documentation

## 2011-09-06 DIAGNOSIS — K0889 Other specified disorders of teeth and supporting structures: Secondary | ICD-10-CM

## 2011-09-06 DIAGNOSIS — K089 Disorder of teeth and supporting structures, unspecified: Secondary | ICD-10-CM | POA: Insufficient documentation

## 2011-09-06 MED ORDER — PENICILLIN V POTASSIUM 250 MG PO TABS: 250.0000 mg | ORAL_TABLET | Freq: Four times a day (QID) | ORAL | Status: AC

## 2011-09-06 MED ORDER — HYDROCODONE-ACETAMINOPHEN 5-500 MG PO TABS: 1.0000 | ORAL_TABLET | Freq: Four times a day (QID) | ORAL | Status: AC | PRN

## 2011-09-06 NOTE — ED Notes (Signed)
Pt reports a cough/cold symptoms x 1 week and dental pain that started 2 days ago.

## 2011-09-06 NOTE — ED Provider Notes (Signed)
History     CSN: [id number]  Arrival date & time 09/06/11  1136   First MD Initiated Contact with Patient 09/06/11 1208      Chief Complaint  Patient presents with  . Cough  . Dental Pain    (Consider location/radiation/quality/duration/timing/severity/associated sxs/prior treatment) HPI Comments: Pt states that part of his left upper tooth cracked and now he is having pain to the tooth  Patient is a 21 y.o. male presenting with cough and tooth pain. The history is provided by the patient.  Cough This is a recurrent problem. The current episode started more than 1 week ago. The problem occurs constantly. The problem has not changed since onset.The cough is productive of sputum. There has been no fever. Pertinent negatives include no ear congestion, no headaches, no shortness of breath and no wheezing. Treatments tried: z pack. The treatment provided no relief.  Dental PainThe primary symptoms include cough. Primary symptoms do not include headaches or shortness of breath. The symptoms began 2 days ago. The symptoms are worsening. The symptoms occur constantly.    Past Medical History  Diagnosis Date  . Renal disorder   . Hyperlipemia     Past Surgical History  Procedure Date  . Tonsillectomy   . Shoulder surgery   . Adenoidectomy     No family history on file.  History  Substance Use Topics  . Smoking status: Former Games developer  . Smokeless tobacco: Not on file  . Alcohol Use: No      Review of Systems  Respiratory: Positive for cough. Negative for shortness of breath and wheezing.   Neurological: Negative for headaches.  All other systems reviewed and are negative.    Allergies  Toradol  Home Medications   Current Outpatient Rx  Name Route Sig Dispense Refill  . HYDROCODONE-ACETAMINOPHEN 5-500 MG PO TABS Oral Take 1-2 tablets by mouth every 6 (six) hours as needed for pain. 6 tablet 0  . PENICILLIN V POTASSIUM 250 MG PO TABS Oral Take 1 tablet (250 mg total)  by mouth 4 (four) times daily. 40 tablet 0    BP 145/90  Pulse 91  Temp(Src) 98.4 F (36.9 C) (Oral)  Resp 18  Ht 6\' 1"  (1.854 m)  Wt 290 lb (131.543 kg)  BMI 38.26 kg/m2  SpO2 98%  Physical Exam  Nursing note and vitals reviewed. Constitutional: He appears well-developed and well-nourished.  HENT:  Head: Normocephalic and atraumatic.  Right Ear: External ear normal.  Left Ear: External ear normal.  Mouth/Throat:    Cardiovascular: Normal rate and regular rhythm.   Pulmonary/Chest: Effort normal and breath sounds normal.    ED Course  Procedures (including critical care time)  Labs Reviewed - No data to display No results found.   1. Toothache       MDM  Will treat for toothache:lungs clear:pt recently on z pack for cough don't think further treatment of that is needed at this time        Teressa Lower, NP 09/06/11 1250

## 2011-09-06 NOTE — ED Provider Notes (Signed)
Medical screening examination/treatment/procedure(s) were performed by non-physician practitioner and as supervising physician I was immediately available for consultation/collaboration.  Heith Haigler, MD 09/06/11 1549 

## 2012-07-09 ENCOUNTER — Encounter (HOSPITAL_BASED_OUTPATIENT_CLINIC_OR_DEPARTMENT_OTHER): Payer: Self-pay | Admitting: *Deleted

## 2012-07-09 ENCOUNTER — Emergency Department (HOSPITAL_BASED_OUTPATIENT_CLINIC_OR_DEPARTMENT_OTHER)
Admission: EM | Admit: 2012-07-09 | Discharge: 2012-07-09 | Disposition: A | Discharge status: Home / Self Care | Payer: Self-pay | Attending: Emergency Medicine | Admitting: Emergency Medicine

## 2012-07-09 DIAGNOSIS — Z87448 Personal history of other diseases of urinary system: Secondary | ICD-10-CM | POA: Insufficient documentation

## 2012-07-09 DIAGNOSIS — K047 Periapical abscess without sinus: Secondary | ICD-10-CM | POA: Insufficient documentation

## 2012-07-09 DIAGNOSIS — E785 Hyperlipidemia, unspecified: Secondary | ICD-10-CM | POA: Insufficient documentation

## 2012-07-09 DIAGNOSIS — F17200 Nicotine dependence, unspecified, uncomplicated: Secondary | ICD-10-CM | POA: Insufficient documentation

## 2012-07-09 MED ORDER — PENICILLIN V POTASSIUM 500 MG PO TABS: 500.0000 mg | ORAL_TABLET | Freq: Three times a day (TID) | ORAL | Status: DC

## 2012-07-09 MED ORDER — HYDROCODONE-ACETAMINOPHEN 5-325 MG PO TABS: 2.0000 | ORAL_TABLET | ORAL | Status: DC | PRN

## 2012-07-09 NOTE — ED Notes (Signed)
Pt amb to room 11 with quick steady gait in nad. Pt reports left lower tooth pain x 2 months. Pt states he has called dentists but they want money to pull it and states he cannot afford it. Pt denies any fevers or other c/o.

## 2012-07-09 NOTE — ED Provider Notes (Signed)
History     CSN: [id number]  Arrival date & time 07/09/12  0808   First MD Initiated Contact with Patient 07/09/12 0827      Chief Complaint  Patient presents with  . Dental Pain     HPI Pt amb to room 11 with quick steady gait in nad. Pt reports left lower tooth pain x 2 months. Pt states he has called dentists but they want money to pull it and states he cannot afford it. Pt denies any fevers or other c/o.  Past Medical History  Diagnosis Date  . Renal disorder   . Hyperlipemia     Past Surgical History  Procedure Date  . Tonsillectomy   . Shoulder surgery   . Adenoidectomy     History reviewed. No pertinent family history.  History  Substance Use Topics  . Smoking status: Current Every Day Smoker  . Smokeless tobacco: Not on file  . Alcohol Use: No      Review of Systems All other systems reviewed and are negative Allergies  Ketorolac tromethamine  Home Medications   Current Outpatient Rx  Name  Route  Sig  Dispense  Refill  . HYDROCODONE-ACETAMINOPHEN 5-325 MG PO TABS   Oral   Take 2 tablets by mouth every 4 (four) hours as needed for pain.   10 tablet   0   . PENICILLIN V POTASSIUM 500 MG PO TABS   Oral   Take 1 tablet (500 mg total) by mouth 3 (three) times daily.   21 tablet   0     BP 132/80  Pulse 82  Temp 98 F (36.7 C) (Oral)  Resp 18  SpO2 100%  Physical Exam  Nursing note and vitals reviewed. Constitutional: He is oriented to person, place, and time. He appears well-developed and well-nourished. No distress.  HENT:  Head: Normocephalic and atraumatic.  Mouth/Throat:    Eyes: Pupils are equal, round, and reactive to light.  Neck: Normal range of motion.  Cardiovascular: Normal rate and intact distal pulses.   Pulmonary/Chest: No respiratory distress.  Abdominal: Normal appearance. He exhibits no distension.  Musculoskeletal: Normal range of motion.  Neurological: He is alert and oriented to person, place, and time. No  cranial nerve deficit.  Skin: Skin is warm and dry. No rash noted.  Psychiatric: He has a normal mood and affect. His behavior is normal.    ED Course  Procedures (including critical care time)  Labs Reviewed - No data to display No results found.   1. Tooth abscess       MDM          Nelia Shi, MD 07/09/12 [phone]

## 2012-11-07 IMAGING — CT CT ABD-PELV W/O CM
2 of 3 series · 17 of 46 positions shown, 19 images · non-contrast
Comparison: CT 12/04/2009

CLINICAL DATA: Abdominal pain radiating to back.  History kidney
stones

CT ABDOMEN AND PELVIS WITHOUT CONTRAST
TECHNIQUE: Multidetector CT imaging of the abdomen and pelvis was
performed following the standard protocol without intravenous
contrast.

[Series 2: renal stone > 200 lbs 5.0 b31f · axial · 0.90mm/px · z∈[-385,+20]mm · 14 of 95 slices shown, 16 images]
[im 7/95  soft-tissue]
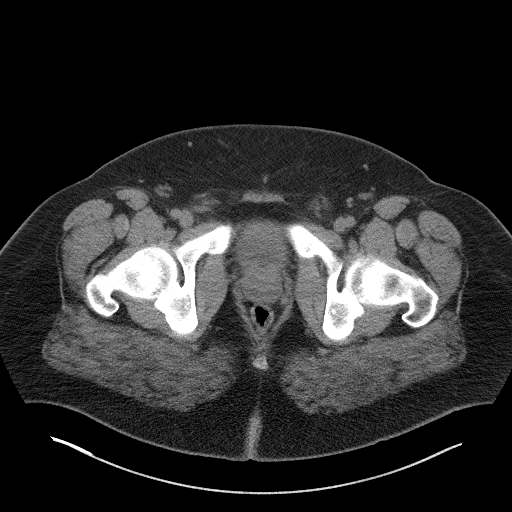
[im 7/95  bone]
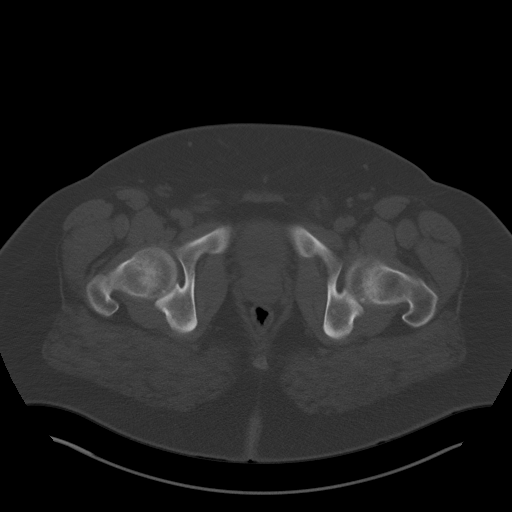
[im 13/95  soft-tissue]
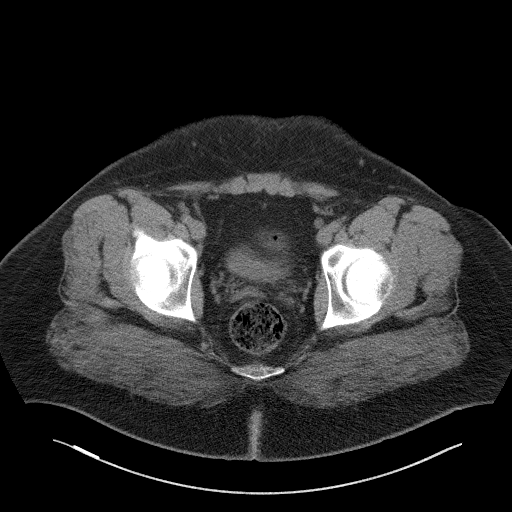
[im 19/95  soft-tissue]
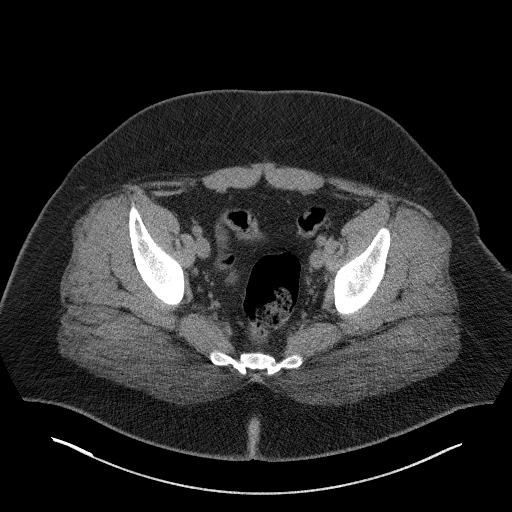
[im 25/95  soft-tissue]
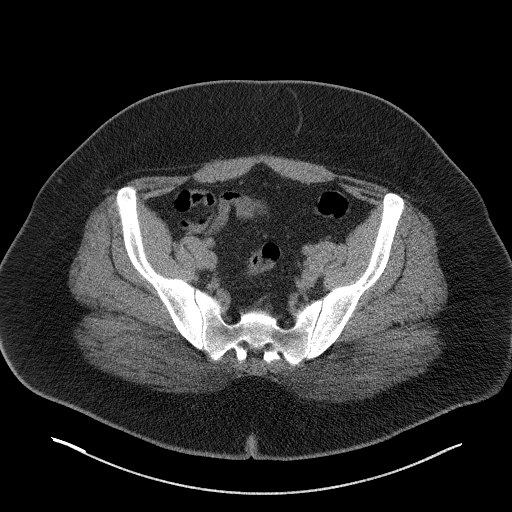
[im 31/95  soft-tissue]
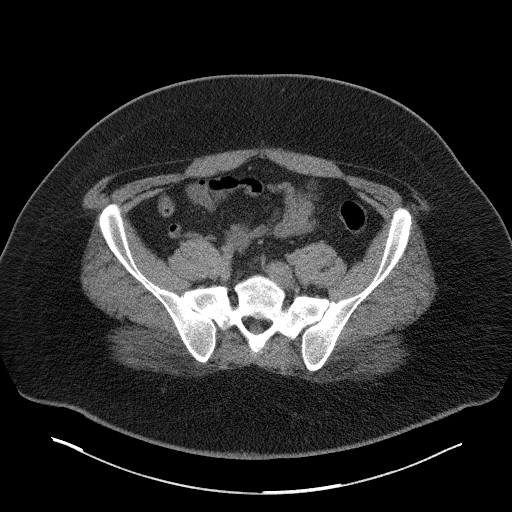
[im 37/95  soft-tissue]
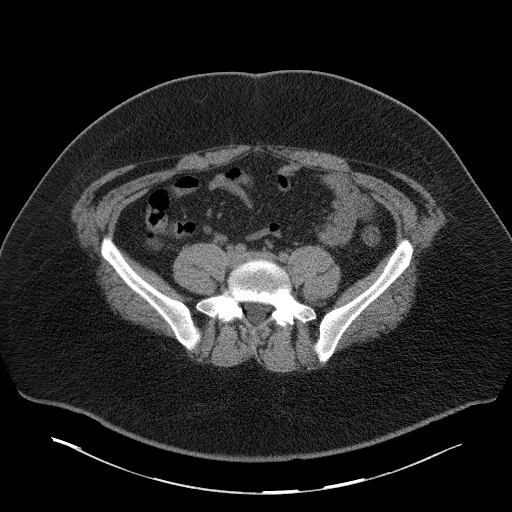
[im 43/95  soft-tissue]
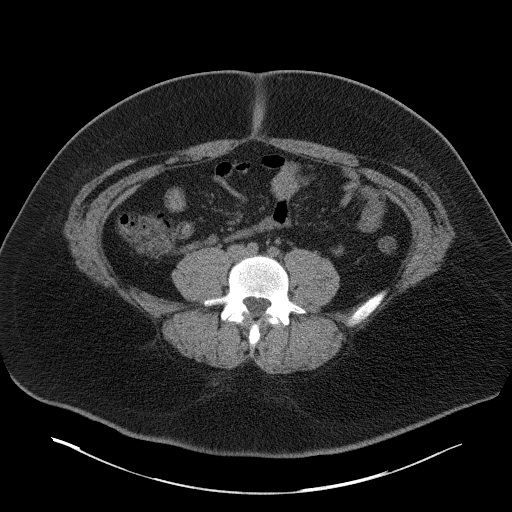
[im 52/95  soft-tissue]
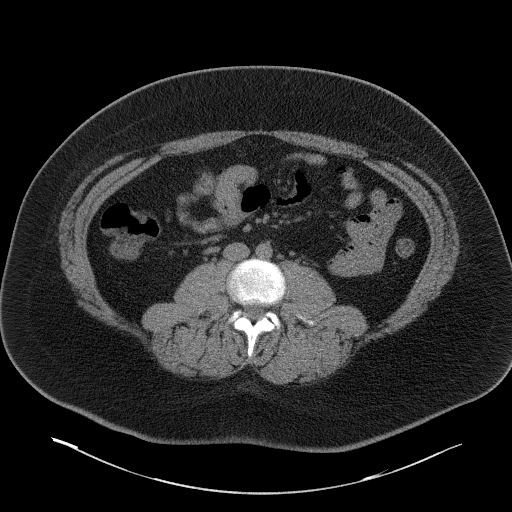
[im 58/95  soft-tissue]
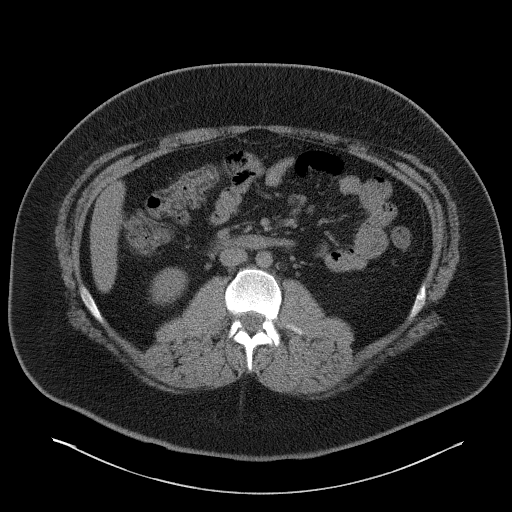
[im 58/95  bone]
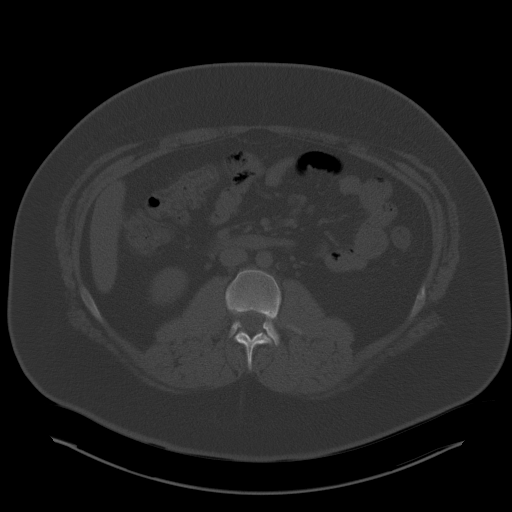
[im 64/95  soft-tissue]
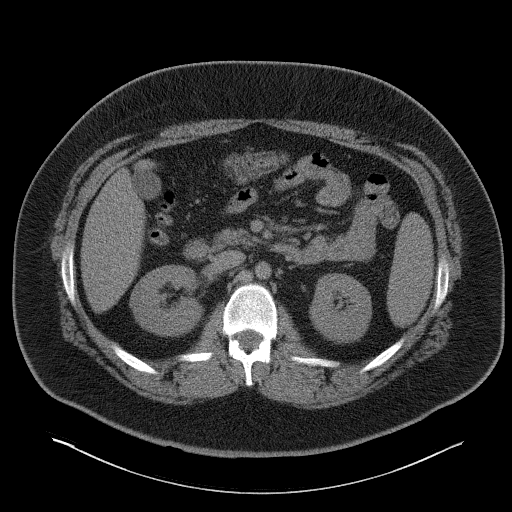
[im 70/95  soft-tissue]
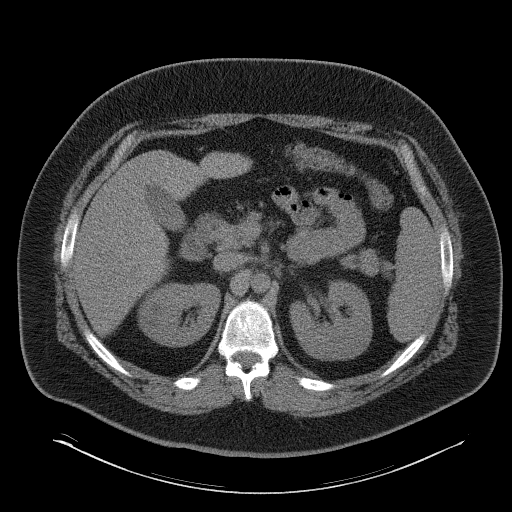
[im 76/95  soft-tissue]
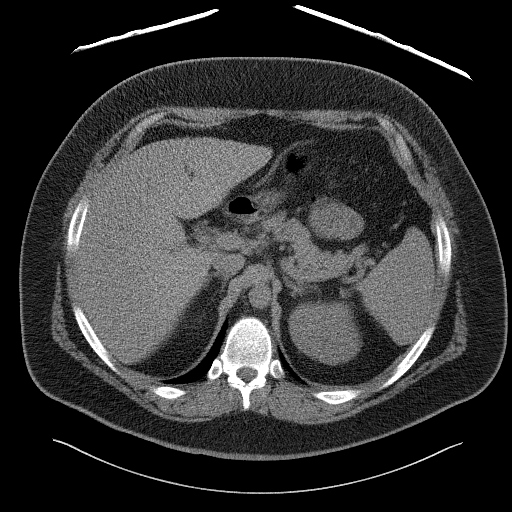
[im 82/95  soft-tissue]
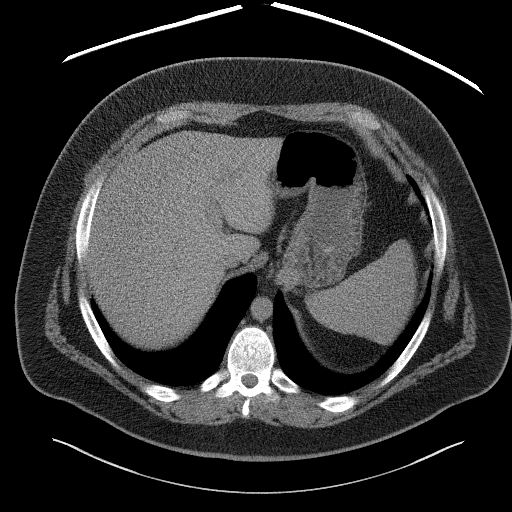
[im 88/95  soft-tissue]
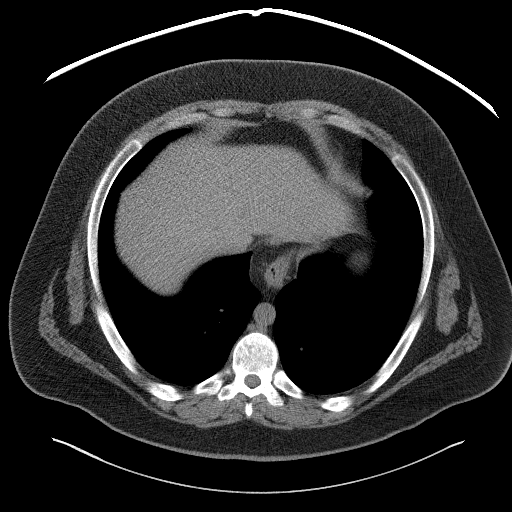

[Series 5: renal stone 3.0 coronal · coronal · 0.96mm/px · 3 of 122 slices shown]
[im 41/122  soft-tissue]
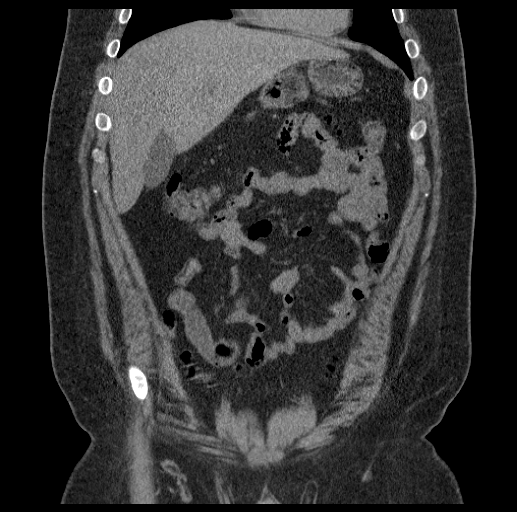
[im 54/122  soft-tissue]
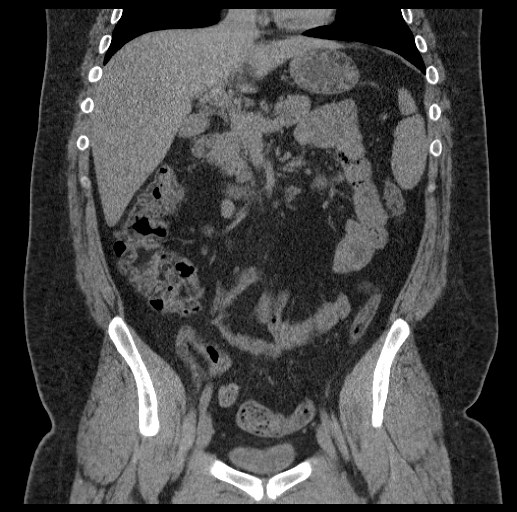
[im 68/122  soft-tissue]
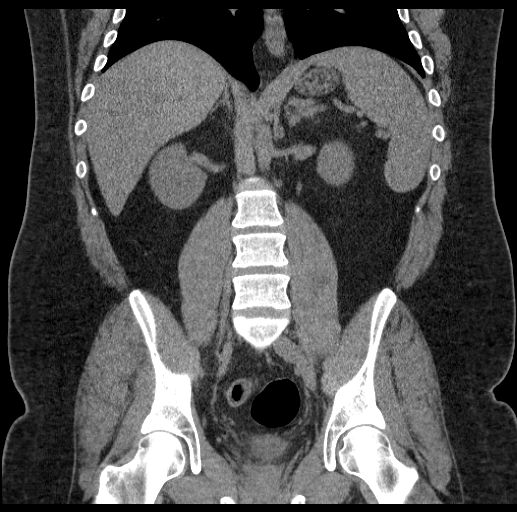

[17 of 46 positions shown; findings below may reference images not displayed]

FINDINGS: Negative for renal calculi or renal obstruction.
Previously there was a stone in the distal right ureter causing
obstruction but this is no longer present.  Left kidney is normal.

Liver and gallbladder normal.  Pancreas and spleen are normal.  No
bowel obstruction.  Small sub centimeter lymph nodes are present in
the right lower quadrant around the terminal ileum.  These are
slightly less prominent than on the prior study.  Appendix is
normal.  No free fluid or mass.  Mild sigmoid diverticulosis.
IMPRESSION: No acute abnormality.  Negative for renal calculi.  Appendix is
normal.

## 2013-05-15 ENCOUNTER — Emergency Department (HOSPITAL_BASED_OUTPATIENT_CLINIC_OR_DEPARTMENT_OTHER): Payer: Medicaid Other

## 2013-05-15 ENCOUNTER — Emergency Department (HOSPITAL_BASED_OUTPATIENT_CLINIC_OR_DEPARTMENT_OTHER)
Admission: EM | Admit: 2013-05-15 | Discharge: 2013-05-15 | Disposition: A | Discharge status: Home / Self Care | Payer: Medicaid Other | Attending: Emergency Medicine | Admitting: Emergency Medicine

## 2013-05-15 ENCOUNTER — Encounter (HOSPITAL_BASED_OUTPATIENT_CLINIC_OR_DEPARTMENT_OTHER): Payer: Self-pay

## 2013-05-15 DIAGNOSIS — W1809XA Striking against other object with subsequent fall, initial encounter: Secondary | ICD-10-CM | POA: Insufficient documentation

## 2013-05-15 DIAGNOSIS — Y9389 Activity, other specified: Secondary | ICD-10-CM | POA: Insufficient documentation

## 2013-05-15 DIAGNOSIS — Z8639 Personal history of other endocrine, nutritional and metabolic disease: Secondary | ICD-10-CM | POA: Insufficient documentation

## 2013-05-15 DIAGNOSIS — Z792 Long term (current) use of antibiotics: Secondary | ICD-10-CM | POA: Insufficient documentation

## 2013-05-15 DIAGNOSIS — Z862 Personal history of diseases of the blood and blood-forming organs and certain disorders involving the immune mechanism: Secondary | ICD-10-CM | POA: Insufficient documentation

## 2013-05-15 DIAGNOSIS — Y929 Unspecified place or not applicable: Secondary | ICD-10-CM | POA: Insufficient documentation

## 2013-05-15 DIAGNOSIS — Z87442 Personal history of urinary calculi: Secondary | ICD-10-CM | POA: Insufficient documentation

## 2013-05-15 DIAGNOSIS — F17200 Nicotine dependence, unspecified, uncomplicated: Secondary | ICD-10-CM | POA: Insufficient documentation

## 2013-05-15 DIAGNOSIS — S20229A Contusion of unspecified back wall of thorax, initial encounter: Secondary | ICD-10-CM | POA: Insufficient documentation

## 2013-05-15 DIAGNOSIS — Z87448 Personal history of other diseases of urinary system: Secondary | ICD-10-CM | POA: Insufficient documentation

## 2013-05-15 HISTORY — DX: Dorsalgia, unspecified: M54.9

## 2013-05-15 MED ORDER — IBUPROFEN 800 MG PO TABS: 800.0000 mg | ORAL_TABLET | Freq: Three times a day (TID) | ORAL | Status: DC

## 2013-05-15 MED ORDER — HYDROCODONE-ACETAMINOPHEN 5-325 MG PO TABS
2.0000 | ORAL_TABLET | Freq: Once | ORAL | Status: AC
  Administered 2013-05-15: 2 via ORAL
  Filled 2013-05-15: qty 2

## 2013-05-15 MED ORDER — ONDANSETRON 4 MG PO TBDP
4.0000 mg | ORAL_TABLET | Freq: Once | ORAL | Status: AC
  Administered 2013-05-15: 4 mg via ORAL
  Filled 2013-05-15: qty 1

## 2013-05-15 MED ORDER — OXYCODONE-ACETAMINOPHEN 5-325 MG PO TABS: 2.0000 | ORAL_TABLET | ORAL | Status: DC | PRN

## 2013-05-15 MED ORDER — HYDROMORPHONE HCL PF 2 MG/ML IJ SOLN
2.0000 mg | Freq: Once | INTRAMUSCULAR | Status: AC
  Administered 2013-05-15: 2 mg via INTRAMUSCULAR
  Filled 2013-05-15: qty 1

## 2013-05-15 NOTE — Discharge Instructions (Signed)
Contusion  A contusion is a deep bruise. Contusions are the result of an injury that caused bleeding under the skin. The contusion may turn blue, purple, or yellow. Minor injuries will give you a painless contusion, but more severe contusions may stay painful and swollen for a few weeks.   CAUSES   A contusion is usually caused by a blow, trauma, or direct force to an area of the body.  SYMPTOMS    Swelling and redness of the injured area.   Bruising of the injured area.   Tenderness and soreness of the injured area.   Pain.  DIAGNOSIS   The diagnosis can be made by taking a history and physical exam. An X-ray, CT scan, or MRI may be needed to determine if there were any associated injuries, such as fractures.  TREATMENT   Specific treatment will depend on what area of the body was injured. In general, the best treatment for a contusion is resting, icing, elevating, and applying cold compresses to the injured area. Over-the-counter medicines may also be recommended for pain control. Ask your caregiver what the best treatment is for your contusion.  HOME CARE INSTRUCTIONS    Put ice on the injured area.   Put ice in a plastic bag.   Place a towel between your skin and the bag.   Leave the ice on for 15-20 minutes, 3-4 times a day.   Only take over-the-counter or prescription medicines for pain, discomfort, or fever as directed by your caregiver. Your caregiver may recommend avoiding anti-inflammatory medicines (aspirin, ibuprofen, and naproxen) for 48 hours because these medicines may increase bruising.   Rest the injured area.   If possible, elevate the injured area to reduce swelling.  SEEK IMMEDIATE MEDICAL CARE IF:    You have increased bruising or swelling.   You have pain that is getting worse.   Your swelling or pain is not relieved with medicines.  MAKE SURE YOU:    Understand these instructions.   Will watch your condition.   Will get help right away if you are not doing well or get  worse.  Document Released: 05/24/2005 Document Revised: 11/06/2011 Document Reviewed: 06/19/2011  Exit[employer] Patient Information 2014 [employer]  Fall Prevention and Home Safety  Falls cause injuries and can affect all age groups. It is possible to use preventive measures to significantly decrease the likelihood of falls. There are many simple measures which can make your home safer and prevent falls.  OUTDOORS   Repair cracks and edges of walkways and driveways.   Remove high doorway thresholds.   Trim shrubbery on the main path into your home.   Have good outside lighting.   Clear walkways of tools, rocks, debris, and clutter.   Check that handrails are not broken and are securely fastened. Both sides of steps should have handrails.   Have leaves, snow, and ice cleared regularly.   Use sand or salt on walkways during winter months.   In the garage, clean up grease or oil spills.  BATHROOM   Install night lights.   Install grab bars by the toilet and in the tub and shower.   Use non-skid mats or decals in the tub or shower.   Place a plastic non-slip stool in the shower to sit on, if needed.   Keep floors dry and clean up all water on the floor immediately.   Remove soap buildup in the tub or shower on a regular basis.   Secure bath   mats with non-slip, double-sided rug tape.   Remove throw rugs and tripping hazards from the floors.  BEDROOMS   Install night lights.   Make sure a bedside light is easy to reach.   Do not use oversized bedding.   Keep a telephone by your bedside.   Have a firm chair with side arms to use for getting dressed.   Remove throw rugs and tripping hazards from the floor.  KITCHEN   Keep handles on pots and pans turned toward the center of the stove. Use back burners when possible.   Clean up spills quickly and allow time for drying.   Avoid walking on wet floors.   Avoid hot utensils and knives.   Position shelves so they are not too high or low.   Place  commonly used objects within easy reach.   If necessary, use a sturdy step stool with a grab bar when reaching.   Keep electrical cables out of the way.   Do not use floor polish or wax that makes floors slippery. If you must use wax, use non-skid floor wax.   Remove throw rugs and tripping hazards from the floor.  STAIRWAYS   Never leave objects on stairs.   Place handrails on both sides of stairways and use them. Fix any loose handrails. Make sure handrails on both sides of the stairways are as long as the stairs.   Check carpeting to make sure it is firmly attached along stairs. Make repairs to worn or loose carpet promptly.   Avoid placing throw rugs at the top or bottom of stairways, or properly secure the rug with carpet tape to prevent slippage. Get rid of throw rugs, if possible.   Have an electrician put in a light switch at the top and bottom of the stairs.  OTHER FALL PREVENTION TIPS   Wear low-heel or rubber-soled shoes that are supportive and fit well. Wear closed toe shoes.   When using a stepladder, make sure it is fully opened and both spreaders are firmly locked. Do not climb a closed stepladder.   Add color or contrast paint or tape to grab bars and handrails in your home. Place contrasting color strips on first and last steps.   Learn and use mobility aids as needed. Install an electrical emergency response system.   Turn on lights to avoid dark areas. Replace light bulbs that burn out immediately. Get light switches that glow.   Arrange furniture to create clear pathways. Keep furniture in the same place.   Firmly attach carpet with non-skid or double-sided tape.   Eliminate uneven floor surfaces.   Select a carpet pattern that does not visually hide the edge of steps.   Be aware of all pets.  OTHER HOME SAFETY TIPS   Set the water temperature for 120 F (48.8 C).   Keep emergency numbers on or near the telephone.   Keep smoke detectors on every level of the home and near  sleeping areas.  Document Released: 08/04/2002 Document Revised: 02/13/2012 Document Reviewed: 11/03/2011  [employer] Patient Information 2014 [employer].

## 2013-05-15 NOTE — ED Notes (Signed)
Notified RN that pts ride is here

## 2013-05-15 NOTE — ED Notes (Signed)
Waiting for ride , before meds given

## 2013-05-15 NOTE — ED Provider Notes (Signed)
CSN: [id number]     Arrival date & time 05/15/13  1141 History   First MD Initiated Contact with Patient 05/15/13 1214     Chief Complaint  Patient presents with  . Fall   (Consider location/radiation/quality/duration/timing/severity/associated sxs/prior Treatment) Patient is a 22 y.o. male presenting with fall. The history is provided by the patient. No language interpreter was used.  Fall This is a new problem. The current episode started today. The problem occurs constantly. The problem has been gradually worsening. Associated symptoms include myalgias. Pertinent negatives include no abdominal pain or joint swelling. The symptoms are aggravated by bending and walking. He has tried nothing for the symptoms.  Pt reports he fell 2 days ago and hit back.  Pt complains of severe pain in his back.  Pt reports pain worse with walking  Past Medical History  Diagnosis Date  . Renal disorder   . Hyperlipemia   . Kidney stone   . Back pain    Past Surgical History  Procedure Laterality Date  . Tonsillectomy    . Shoulder surgery    . Adenoidectomy     No family history on file. History  Substance Use Topics  . Smoking status: Current Every Day Smoker  . Smokeless tobacco: Not on file  . Alcohol Use: No    Review of Systems  Gastrointestinal: Negative for abdominal pain.  Musculoskeletal: Positive for myalgias, back pain and gait problem. Negative for joint swelling.  All other systems reviewed and are negative.    Allergies  Ketorolac tromethamine  Home Medications   Current Outpatient Rx  Name  Route  Sig  Dispense  Refill  . HYDROcodone-acetaminophen (NORCO/VICODIN) 5-325 MG per tablet   Oral   Take 2 tablets by mouth every 4 (four) hours as needed for pain.   10 tablet   0   . penicillin v potassium (VEETID) 500 MG tablet   Oral   Take 1 tablet (500 mg total) by mouth 3 (three) times daily.   21 tablet   0    BP 145/82  Pulse 74  Temp(Src) 98.3 F (36.8 C)  (Oral)  Resp 16  Ht 6\' 1"  (1.854 m)  Wt 265 lb (120.203 kg)  BMI 34.97 kg/m2  SpO2 100% Physical Exam  Nursing note and vitals reviewed. Constitutional: He is oriented to person, place, and time. He appears well-developed and well-nourished.  HENT:  Head: Normocephalic and atraumatic.  Eyes: Conjunctivae and EOM are normal. Pupils are equal, round, and reactive to light.  Neck: Neck supple.  Cardiovascular: Normal rate.   Pulmonary/Chest: Effort normal.  Abdominal: Soft.  Musculoskeletal: Normal range of motion.  Neurological: He is alert and oriented to person, place, and time. He has normal reflexes.  Skin: Skin is warm.    ED Course  Procedures (including critical care time) Labs Review Labs Reviewed - No data to display Imaging Review No results found.  MDM   1. Contusion, back, unspecified laterality, initial encounter    t spine shows wedge deformity at t12.   Ct shows no fractures,  Area is 2nd to kyphosis .   Pt given rx for ibuprofen and hydrocodone.   Pt referred to Dr. Pearletha Forge for follow up.   Lonia Skinner Goldfield, PA-C 05/15/13 [phone]

## 2013-05-15 NOTE — ED Notes (Signed)
Fell 2 days ago down stairs while helping uncle move-pain to mid and upper back

## 2013-05-16 NOTE — ED Provider Notes (Signed)
Medical screening examination/treatment/procedure(s) were performed by non-physician practitioner and as supervising physician I was immediately available for consultation/collaboration.   Gwyneth Sprout, MD 05/16/13 [phone]

## 2013-06-10 ENCOUNTER — Encounter (HOSPITAL_BASED_OUTPATIENT_CLINIC_OR_DEPARTMENT_OTHER): Payer: Self-pay | Admitting: Emergency Medicine

## 2013-06-10 ENCOUNTER — Emergency Department (HOSPITAL_BASED_OUTPATIENT_CLINIC_OR_DEPARTMENT_OTHER)
Admission: EM | Admit: 2013-06-10 | Discharge: 2013-06-10 | Disposition: A | Discharge status: Home / Self Care | Payer: Medicaid Other | Attending: Emergency Medicine | Admitting: Emergency Medicine

## 2013-06-10 DIAGNOSIS — K029 Dental caries, unspecified: Secondary | ICD-10-CM | POA: Insufficient documentation

## 2013-06-10 DIAGNOSIS — Z862 Personal history of diseases of the blood and blood-forming organs and certain disorders involving the immune mechanism: Secondary | ICD-10-CM | POA: Insufficient documentation

## 2013-06-10 DIAGNOSIS — X58XXXA Exposure to other specified factors, initial encounter: Secondary | ICD-10-CM | POA: Insufficient documentation

## 2013-06-10 DIAGNOSIS — Y9389 Activity, other specified: Secondary | ICD-10-CM | POA: Insufficient documentation

## 2013-06-10 DIAGNOSIS — Z87442 Personal history of urinary calculi: Secondary | ICD-10-CM | POA: Insufficient documentation

## 2013-06-10 DIAGNOSIS — Z8639 Personal history of other endocrine, nutritional and metabolic disease: Secondary | ICD-10-CM | POA: Insufficient documentation

## 2013-06-10 DIAGNOSIS — F17200 Nicotine dependence, unspecified, uncomplicated: Secondary | ICD-10-CM | POA: Insufficient documentation

## 2013-06-10 DIAGNOSIS — S025XXA Fracture of tooth (traumatic), initial encounter for closed fracture: Secondary | ICD-10-CM | POA: Insufficient documentation

## 2013-06-10 DIAGNOSIS — Z792 Long term (current) use of antibiotics: Secondary | ICD-10-CM | POA: Insufficient documentation

## 2013-06-10 DIAGNOSIS — Y929 Unspecified place or not applicable: Secondary | ICD-10-CM | POA: Insufficient documentation

## 2013-06-10 MED ORDER — HYDROCODONE-ACETAMINOPHEN 5-325 MG PO TABS: 1.0000 | ORAL_TABLET | Freq: Four times a day (QID) | ORAL | Status: DC | PRN

## 2013-06-10 MED ORDER — CLINDAMYCIN HCL 150 MG PO CAPS: 150.0000 mg | ORAL_CAPSULE | Freq: Three times a day (TID) | ORAL | Status: DC

## 2013-06-10 MED ORDER — CLINDAMYCIN HCL 150 MG PO CAPS
300.0000 mg | ORAL_CAPSULE | Freq: Once | ORAL | Status: AC
  Administered 2013-06-10: 300 mg via ORAL
  Filled 2013-06-10: qty 2

## 2013-06-10 NOTE — ED Notes (Signed)
MD at bedside. 

## 2013-06-10 NOTE — ED Provider Notes (Signed)
CSN: [id number]     Arrival date & time 06/10/13  2240 History  This chart was scribed for Hanley Seamen, MD by Danella Maiers, ED Scribe. This patient was seen in room MH10/MH10 and the patient's care was started at 11:32 PM.   Chief Complaint  Patient presents with  . Dental Pain   The history is provided by the patient. No language interpreter was used.   HPI Comments: Andrew George is a 22 y.o. male who presents to the Emergency Department complaining of constant left lower second molar pain since yesterday after a piece broke off while eating a hamburger. He states the pain radiates to the left side of his face. The pain is moderate to severe, worse with eating, and sharp in nature. He does not have a Education officer, community.   Past Medical History  Diagnosis Date  . Renal disorder   . Hyperlipemia   . Kidney stone   . Back pain    Past Surgical History  Procedure Laterality Date  . Tonsillectomy    . Shoulder surgery    . Adenoidectomy     No family history on file. History  Substance Use Topics  . Smoking status: Current Every Day Smoker  . Smokeless tobacco: Not on file  . Alcohol Use: No    Review of Systems A complete 10 system review of systems was obtained and all systems are negative except as noted in the HPI and PMH.   Allergies  Ketorolac tromethamine  Home Medications   Current Outpatient Rx  Name  Route  Sig  Dispense  Refill  . clindamycin (CLEOCIN) 150 MG capsule   Oral   Take 1 capsule (150 mg total) by mouth 3 (three) times daily.   20 capsule   0   . HYDROcodone-acetaminophen (NORCO/VICODIN) 5-325 MG per tablet   Oral   Take 2 tablets by mouth every 4 (four) hours as needed for pain.   10 tablet   0   . HYDROcodone-acetaminophen (NORCO/VICODIN) 5-325 MG per tablet   Oral   Take 1-2 tablets by mouth every 6 (six) hours as needed for pain.   20 tablet   0   . ibuprofen (ADVIL,MOTRIN) 800 MG tablet   Oral   Take 1 tablet (800 mg total) by mouth 3  (three) times daily.   21 tablet   0   . oxyCODONE-acetaminophen (PERCOCET/ROXICET) 5-325 MG per tablet   Oral   Take 2 tablets by mouth every 4 (four) hours as needed for pain.   16 tablet   0   . penicillin v potassium (VEETID) 500 MG tablet   Oral   Take 1 tablet (500 mg total) by mouth 3 (three) times daily.   21 tablet   0    BP 147/80  Pulse 101  Temp(Src) 98.7 F (37.1 C) (Oral)  Resp 20  Wt 270 lb (122.471 kg)  BMI 35.63 kg/m2  SpO2 98% Physical Exam General: Well-developed, well-nourished male in no acute distress; appearance consistent with age of record HENT: normocephalic; atraumatic. Multiple caries especially of the molars. The left lower first molar is decayed to the gumline and is tender to percussion. No associated swelling under the mandible.  Eyes: pupils equal, round and reactive to light; extraocular muscles intact Neck: supple; no lymphadenopathy Heart: regular rate and rhythm; no murmurs, rubs or gallops Lungs: clear to auscultation bilaterally Abdomen: soft; nondistended Extremities: No deformity; full range of motion Neurologic: Awake, alert and oriented; motor function  intact in all extremities and symmetric; no facial droop Skin: Warm and dry Psychiatric: Normal mood and affect   ED Course  Procedures (including critical care time)  DIAGNOSTIC STUDIES: Oxygen Saturation is 98% on RA, normal by my interpretation.    COORDINATION OF CARE: 11:39 PM- Discussed treatment plan with pt which includes pain medication and dentist referral. Pt agrees to plan.   MDM   1. Pain due to dental caries    I personally performed the services described in this documentation, which was scribed in my presence.  The recorded information has been reviewed and is accurate.      Hanley Seamen, MD 06/10/13 [phone]

## 2013-06-10 NOTE — ED Notes (Signed)
Dental pain since yesterday.  

## 2013-08-05 ENCOUNTER — Emergency Department (HOSPITAL_BASED_OUTPATIENT_CLINIC_OR_DEPARTMENT_OTHER)
Admission: EM | Admit: 2013-08-05 | Discharge: 2013-08-05 | Disposition: A | Discharge status: Home / Self Care | Payer: Medicaid Other | Attending: Emergency Medicine | Admitting: Emergency Medicine

## 2013-08-05 ENCOUNTER — Encounter (HOSPITAL_BASED_OUTPATIENT_CLINIC_OR_DEPARTMENT_OTHER): Payer: Self-pay | Admitting: Emergency Medicine

## 2013-08-05 DIAGNOSIS — M549 Dorsalgia, unspecified: Secondary | ICD-10-CM

## 2013-08-05 DIAGNOSIS — Z792 Long term (current) use of antibiotics: Secondary | ICD-10-CM | POA: Insufficient documentation

## 2013-08-05 DIAGNOSIS — Z8639 Personal history of other endocrine, nutritional and metabolic disease: Secondary | ICD-10-CM | POA: Insufficient documentation

## 2013-08-05 DIAGNOSIS — IMO0002 Reserved for concepts with insufficient information to code with codable children: Secondary | ICD-10-CM | POA: Insufficient documentation

## 2013-08-05 DIAGNOSIS — F17200 Nicotine dependence, unspecified, uncomplicated: Secondary | ICD-10-CM | POA: Insufficient documentation

## 2013-08-05 DIAGNOSIS — Y929 Unspecified place or not applicable: Secondary | ICD-10-CM | POA: Insufficient documentation

## 2013-08-05 DIAGNOSIS — Z862 Personal history of diseases of the blood and blood-forming organs and certain disorders involving the immune mechanism: Secondary | ICD-10-CM | POA: Insufficient documentation

## 2013-08-05 DIAGNOSIS — X500XXA Overexertion from strenuous movement or load, initial encounter: Secondary | ICD-10-CM | POA: Insufficient documentation

## 2013-08-05 DIAGNOSIS — Z87442 Personal history of urinary calculi: Secondary | ICD-10-CM | POA: Insufficient documentation

## 2013-08-05 DIAGNOSIS — Z791 Long term (current) use of non-steroidal anti-inflammatories (NSAID): Secondary | ICD-10-CM | POA: Insufficient documentation

## 2013-08-05 DIAGNOSIS — Y9389 Activity, other specified: Secondary | ICD-10-CM | POA: Insufficient documentation

## 2013-08-05 MED ORDER — CYCLOBENZAPRINE HCL 10 MG PO TABS
10.0000 mg | ORAL_TABLET | Freq: Two times a day (BID) | ORAL | Status: DC | PRN
Start: 2013-08-05 — End: 2014-07-13

## 2013-08-05 MED ORDER — HYDROCODONE-ACETAMINOPHEN 5-325 MG PO TABS: 1.0000 | ORAL_TABLET | Freq: Four times a day (QID) | ORAL | Status: DC | PRN

## 2013-08-05 NOTE — ED Provider Notes (Signed)
CSN: [id number]     Arrival date & time 08/05/13  0808 History   First MD Initiated Contact with Patient 08/05/13 [phone]     Chief Complaint  Patient presents with  . Back Pain   (Consider location/radiation/quality/duration/timing/severity/associated sxs/prior Treatment) Patient is a 22 y.o. male presenting with back pain. The history is provided by the patient.  Back Pain Location:  Lumbar spine Quality:  Aching Radiates to:  L posterior upper leg Pain severity:  Moderate Pain is:  Same all the time Onset quality:  Sudden Timing:  Constant Progression:  Unchanged Chronicity:  New Context: lifting heavy objects and twisting   Relieved by:  Nothing Ineffective treatments:  Ibuprofen Associated symptoms: no fever     Past Medical History  Diagnosis Date  . Renal disorder   . Hyperlipemia   . Kidney stone   . Back pain    Past Surgical History  Procedure Laterality Date  . Tonsillectomy    . Shoulder surgery    . Adenoidectomy     No family history on file. History  Substance Use Topics  . Smoking status: Current Every Day Smoker -- 1.00 packs/day    Types: Cigarettes  . Smokeless tobacco: Not on file  . Alcohol Use: No    Review of Systems  Constitutional: Negative for fever.  Respiratory: Negative for cough and shortness of breath.   Gastrointestinal: Negative for vomiting.  Genitourinary: Negative for difficulty urinating.  Musculoskeletal: Positive for back pain. Negative for gait problem, myalgias, neck pain and neck stiffness.  All other systems reviewed and are negative.    Allergies  Ketorolac tromethamine  Home Medications   Current Outpatient Rx  Name  Route  Sig  Dispense  Refill  . clindamycin (CLEOCIN) 150 MG capsule   Oral   Take 1 capsule (150 mg total) by mouth 3 (three) times daily.   20 capsule   0   . cyclobenzaprine (FLEXERIL) 10 MG tablet   Oral   Take 1 tablet (10 mg total) by mouth 2 (two) times daily as needed for muscle  spasms.   20 tablet   0   . HYDROcodone-acetaminophen (NORCO/VICODIN) 5-325 MG per tablet   Oral   Take 2 tablets by mouth every 4 (four) hours as needed for pain.   10 tablet   0   . HYDROcodone-acetaminophen (NORCO/VICODIN) 5-325 MG per tablet   Oral   Take 1-2 tablets by mouth every 6 (six) hours as needed for pain.   20 tablet   0   . HYDROcodone-acetaminophen (NORCO/VICODIN) 5-325 MG per tablet   Oral   Take 1 tablet by mouth every 6 (six) hours as needed for moderate pain.   15 tablet   0   . ibuprofen (ADVIL,MOTRIN) 800 MG tablet   Oral   Take 1 tablet (800 mg total) by mouth 3 (three) times daily.   21 tablet   0   . oxyCODONE-acetaminophen (PERCOCET/ROXICET) 5-325 MG per tablet   Oral   Take 2 tablets by mouth every 4 (four) hours as needed for pain.   16 tablet   0   . penicillin v potassium (VEETID) 500 MG tablet   Oral   Take 1 tablet (500 mg total) by mouth 3 (three) times daily.   21 tablet   0    BP 151/84  Pulse 92  Temp(Src) 98.3 F (36.8 C) (Oral)  Resp 18  Ht 6\' 1"  (1.854 m)  Wt 255 lb (115.667 kg)  BMI 33.65 kg/m2  SpO2 98% Physical Exam  Nursing note and vitals reviewed. Constitutional: He is oriented to person, place, and time. He appears well-developed and well-nourished. No distress.  HENT:  Head: Normocephalic and atraumatic.  Mouth/Throat: No oropharyngeal exudate.  Eyes: EOM are normal. Pupils are equal, round, and reactive to light.  Neck: Normal range of motion. Neck supple.  Cardiovascular: Normal rate and regular rhythm.  Exam reveals no friction rub.   No murmur heard. Pulmonary/Chest: Effort normal and breath sounds normal. No respiratory distress. He has no wheezes. He has no rales.  Abdominal: He exhibits no distension. There is no tenderness. There is no rebound.  Musculoskeletal: Normal range of motion. He exhibits no edema.       Thoracic back: He exhibits no tenderness and no bony tenderness.       Lumbar back: He  exhibits no tenderness and no bony tenderness.  Neurological: He is alert and oriented to person, place, and time.  Reflex Scores:      Patellar reflexes are 2+ on the right side and 2+ on the left side. Skin: He is not diaphoretic.    ED Course  Procedures (including critical care time) Labs Review Labs Reviewed - No data to display Imaging Review No results found.  EKG Interpretation   None       MDM   1. Back pain    73M here with acute back pain. Bent over to grab his son this morning and felt upper lumbar pain. Has pain radiating down back of L leg. No urinary incontinence/retention. No saddle anesthesia. AFVSS here. Normal strength and sensation. Normal LE reflexes. Likely lumbar strain. Vicodin and flexeril given.     Dagmar Hait, MD 08/05/13 [phone]

## 2013-08-05 NOTE — ED Notes (Signed)
Playing with son this morning and twisted back wrong.  Bil mid back pain.  Hx of vertebral issue with back pain.

## 2014-01-30 ENCOUNTER — Encounter (HOSPITAL_BASED_OUTPATIENT_CLINIC_OR_DEPARTMENT_OTHER): Payer: Self-pay | Admitting: Emergency Medicine

## 2014-01-30 ENCOUNTER — Emergency Department (HOSPITAL_BASED_OUTPATIENT_CLINIC_OR_DEPARTMENT_OTHER)
Admission: EM | Admit: 2014-01-30 | Discharge: 2014-01-30 | Disposition: A | Discharge status: Home / Self Care | Payer: Medicaid Other | Attending: Emergency Medicine | Admitting: Emergency Medicine

## 2014-01-30 DIAGNOSIS — K089 Disorder of teeth and supporting structures, unspecified: Secondary | ICD-10-CM | POA: Insufficient documentation

## 2014-01-30 DIAGNOSIS — F17200 Nicotine dependence, unspecified, uncomplicated: Secondary | ICD-10-CM | POA: Insufficient documentation

## 2014-01-30 DIAGNOSIS — Z79899 Other long term (current) drug therapy: Secondary | ICD-10-CM | POA: Insufficient documentation

## 2014-01-30 DIAGNOSIS — Z791 Long term (current) use of non-steroidal anti-inflammatories (NSAID): Secondary | ICD-10-CM | POA: Insufficient documentation

## 2014-01-30 DIAGNOSIS — K029 Dental caries, unspecified: Secondary | ICD-10-CM | POA: Insufficient documentation

## 2014-01-30 DIAGNOSIS — E785 Hyperlipidemia, unspecified: Secondary | ICD-10-CM | POA: Insufficient documentation

## 2014-01-30 DIAGNOSIS — Z87442 Personal history of urinary calculi: Secondary | ICD-10-CM | POA: Insufficient documentation

## 2014-01-30 DIAGNOSIS — Z792 Long term (current) use of antibiotics: Secondary | ICD-10-CM | POA: Insufficient documentation

## 2014-01-30 DIAGNOSIS — K0889 Other specified disorders of teeth and supporting structures: Secondary | ICD-10-CM

## 2014-01-30 MED ORDER — IBUPROFEN 800 MG PO TABS: 800.0000 mg | ORAL_TABLET | Freq: Three times a day (TID) | ORAL | Status: DC

## 2014-01-30 MED ORDER — AMOXICILLIN 500 MG PO CAPS: 500.0000 mg | ORAL_CAPSULE | Freq: Three times a day (TID) | ORAL | Status: AC

## 2014-01-30 MED ORDER — HYDROCODONE-ACETAMINOPHEN 5-325 MG PO TABS: 2.0000 | ORAL_TABLET | ORAL | Status: DC | PRN

## 2014-01-30 NOTE — ED Notes (Signed)
Dental pain x 3 days.  

## 2014-01-30 NOTE — ED Provider Notes (Signed)
CSN: [id number]     Arrival date & time 01/30/14  1931 History   First MD Initiated Contact with Patient 01/30/14 1958     Chief Complaint  Patient presents with  . Dental Pain     (Consider location/radiation/quality/duration/timing/severity/associated sxs/prior Treatment) Patient is a 23 y.o. male presenting with tooth pain. The history is provided by the patient. No language interpreter was used.  Dental Pain Location:  Upper Upper teeth location:  16/LU 3rd molar, 15/LU 2nd molar and 14/LU 1st molar Quality:  Aching Severity:  Moderate Onset quality:  Gradual Duration:  3 days Timing:  Constant Progression:  Worsening Chronicity:  New Context: dental caries and poor dentition   Relieved by:  Nothing Worsened by:  Nothing tried Ineffective treatments:  None tried Associated symptoms: facial swelling     Past Medical History  Diagnosis Date  . Renal disorder   . Hyperlipemia   . Kidney stone   . Back pain    Past Surgical History  Procedure Laterality Date  . Tonsillectomy    . Shoulder surgery    . Adenoidectomy     No family history on file. History  Substance Use Topics  . Smoking status: Current Every Day Smoker -- 1.00 packs/day    Types: Cigarettes  . Smokeless tobacco: Not on file  . Alcohol Use: No    Review of Systems  HENT: Positive for facial swelling.   All other systems reviewed and are negative.     Allergies  Ketorolac tromethamine  Home Medications   Prior to Admission medications   Medication Sig Start Date End Date Taking? Authorizing Provider  amoxicillin (AMOXIL) 500 MG capsule Take 1 capsule (500 mg total) by mouth 3 (three) times daily. 01/30/14 02/09/14  Elson Areas, PA-C  clindamycin (CLEOCIN) 150 MG capsule Take 1 capsule (150 mg total) by mouth 3 (three) times daily. 06/10/13   John L Molpus, MD  cyclobenzaprine (FLEXERIL) 10 MG tablet Take 1 tablet (10 mg total) by mouth 2 (two) times daily as needed for muscle spasms.  08/05/13   Dagmar Hait, MD  HYDROcodone-acetaminophen (NORCO/VICODIN) 5-325 MG per tablet Take 2 tablets by mouth every 4 (four) hours as needed for pain. 07/09/12   Nelia Shi, MD  HYDROcodone-acetaminophen (NORCO/VICODIN) 5-325 MG per tablet Take 1-2 tablets by mouth every 6 (six) hours as needed for pain. 06/10/13   Carlisle Beers Molpus, MD  HYDROcodone-acetaminophen (NORCO/VICODIN) 5-325 MG per tablet Take 1 tablet by mouth every 6 (six) hours as needed for moderate pain. 08/05/13   Dagmar Hait, MD  HYDROcodone-acetaminophen (NORCO/VICODIN) 5-325 MG per tablet Take 2 tablets by mouth every 4 (four) hours as needed. 01/30/14   Elson Areas, PA-C  ibuprofen (ADVIL,MOTRIN) 800 MG tablet Take 1 tablet (800 mg total) by mouth 3 (three) times daily. 05/15/13   Elson Areas, PA-C  ibuprofen (ADVIL,MOTRIN) 800 MG tablet Take 1 tablet (800 mg total) by mouth 3 (three) times daily. 01/30/14   Elson Areas, PA-C  oxyCODONE-acetaminophen (PERCOCET/ROXICET) 5-325 MG per tablet Take 2 tablets by mouth every 4 (four) hours as needed for pain. 05/15/13   Elson Areas, PA-C  penicillin v potassium (VEETID) 500 MG tablet Take 1 tablet (500 mg total) by mouth 3 (three) times daily. 07/09/12   Nelia Shi, MD   BP 145/105  Pulse 90  Temp(Src) 98.1 F (36.7 C) (Oral)  Resp 18  Ht 6\' 1"  (1.854 m)  Wt 255 lb (115.667  kg)  BMI 33.65 kg/m2  SpO2 100% Physical Exam  Nursing note and vitals reviewed. Constitutional: He is oriented to person, place, and time. He appears well-developed and well-nourished.  HENT:  Head: Normocephalic and atraumatic.  Lower gum swelling, decayed teeth.   Eyes: Conjunctivae and EOM are normal. Pupils are equal, round, and reactive to light.  Neck: Normal range of motion.  Pulmonary/Chest: Effort normal.  Abdominal: He exhibits no distension.  Musculoskeletal: Normal range of motion.  Neurological: He is alert and oriented to person, place, and time.   Psychiatric: He has a normal mood and affect.    ED Course  Procedures (including critical care time) Labs Review Labs Reviewed - No data to display  Imaging Review No results found.   EKG Interpretation None      MDM   Final diagnoses:  Toothache    Hydrocodone amoxicillian  [place], and farless referral    Elson AreasLeslie K Jozie Wulf, PA-C 01/30/14 2016

## 2014-01-30 NOTE — Discharge Instructions (Signed)

## 2014-01-31 NOTE — ED Provider Notes (Signed)
Medical screening examination/treatment/procedure(s) were performed by non-physician practitioner and as supervising physician I was immediately available for consultation/collaboration.   Toy Baker, MD 01/31/14 [phone]

## 2014-02-05 ENCOUNTER — Encounter (HOSPITAL_BASED_OUTPATIENT_CLINIC_OR_DEPARTMENT_OTHER): Payer: Self-pay | Admitting: Emergency Medicine

## 2014-02-05 ENCOUNTER — Emergency Department (HOSPITAL_BASED_OUTPATIENT_CLINIC_OR_DEPARTMENT_OTHER)
Admission: EM | Admit: 2014-02-05 | Discharge: 2014-02-05 | Disposition: A | Discharge status: Home / Self Care | Payer: Medicaid Other | Attending: Emergency Medicine | Admitting: Emergency Medicine

## 2014-02-05 DIAGNOSIS — I10 Essential (primary) hypertension: Secondary | ICD-10-CM | POA: Insufficient documentation

## 2014-02-05 DIAGNOSIS — Z8639 Personal history of other endocrine, nutritional and metabolic disease: Secondary | ICD-10-CM | POA: Insufficient documentation

## 2014-02-05 DIAGNOSIS — R221 Localized swelling, mass and lump, neck: Secondary | ICD-10-CM

## 2014-02-05 DIAGNOSIS — Z792 Long term (current) use of antibiotics: Secondary | ICD-10-CM | POA: Insufficient documentation

## 2014-02-05 DIAGNOSIS — K089 Disorder of teeth and supporting structures, unspecified: Secondary | ICD-10-CM | POA: Insufficient documentation

## 2014-02-05 DIAGNOSIS — Z87448 Personal history of other diseases of urinary system: Secondary | ICD-10-CM | POA: Insufficient documentation

## 2014-02-05 DIAGNOSIS — Z87442 Personal history of urinary calculi: Secondary | ICD-10-CM | POA: Insufficient documentation

## 2014-02-05 DIAGNOSIS — K029 Dental caries, unspecified: Secondary | ICD-10-CM

## 2014-02-05 DIAGNOSIS — Z791 Long term (current) use of non-steroidal anti-inflammatories (NSAID): Secondary | ICD-10-CM | POA: Insufficient documentation

## 2014-02-05 DIAGNOSIS — K0889 Other specified disorders of teeth and supporting structures: Secondary | ICD-10-CM

## 2014-02-05 DIAGNOSIS — Z862 Personal history of diseases of the blood and blood-forming organs and certain disorders involving the immune mechanism: Secondary | ICD-10-CM | POA: Insufficient documentation

## 2014-02-05 DIAGNOSIS — F17200 Nicotine dependence, unspecified, uncomplicated: Secondary | ICD-10-CM | POA: Insufficient documentation

## 2014-02-05 DIAGNOSIS — R220 Localized swelling, mass and lump, head: Secondary | ICD-10-CM | POA: Insufficient documentation

## 2014-02-05 MED ORDER — IBUPROFEN 800 MG PO TABS
800.0000 mg | ORAL_TABLET | Freq: Once | ORAL | Status: AC
  Administered 2014-02-05: 800 mg via ORAL
  Filled 2014-02-05: qty 1

## 2014-02-05 NOTE — ED Notes (Signed)
Pt c/o tooth pain on his lower left wisdom tooth. Pt was seen here 5 days ago and given rx for antibiotic. He sts he has been taking same as prescribed but his face is getting more swollen. Is scheduled to see the dentist tomorrow.

## 2014-02-05 NOTE — Discharge Instructions (Signed)
°  Continue antibiotics and keep appointment with dentist tomorrow as scheduled.  Dental Pain A tooth ache may be caused by cavities (tooth decay). Cavities expose the nerve of the tooth to air and hot or cold temperatures. It may come from an infection or abscess (also called a boil or furuncle) around your tooth. It is also often caused by dental caries (tooth decay). This causes the pain you are having. DIAGNOSIS  Your caregiver can diagnose this problem by exam. TREATMENT   If caused by an infection, it may be treated with medications which kill germs (antibiotics) and pain medications as prescribed by your caregiver. Take medications as directed.  Only take over-the-counter or prescription medicines for pain, discomfort, or fever as directed by your caregiver.  Whether the tooth ache today is caused by infection or dental disease, you should see your dentist as soon as possible for further care. SEEK MEDICAL CARE IF: The exam and treatment you received today has been provided on an emergency basis only. This is not a substitute for complete medical or dental care. If your problem worsens or new problems (symptoms) appear, and you are unable to meet with your dentist, call or return to this location. SEEK IMMEDIATE MEDICAL CARE IF:   You have a fever.  You develop redness and swelling of your face, jaw, or neck.  You are unable to open your mouth.  You have severe pain uncontrolled by pain medicine. MAKE SURE YOU:   Understand these instructions.  Will watch your condition.  Will get help right away if you are not doing well or get worse. Document Released: 08/14/2005 Document Revised: 11/06/2011 Document Reviewed: 04/01/2008 [facility] Patient Information 2014 [place], [place]. Hypertension Hypertension is another name for high blood pressure. High blood pressure may mean that your heart needs to work harder to pump blood. Blood pressure consists of two numbers, which includes a  higher number over a lower number (example: 110/72). HOME CARE   Make lifestyle changes as told by your doctor. This may include weight loss and exercise.  Take your blood pressure medicine every day.  Limit how much salt you use.  Stop smoking if you smoke.  Do not use drugs.  Talk to your doctor if you are using decongestants or birth control pills. These medicines might make blood pressure higher.  Females should not drink more than 1 alcoholic drink per day. Males should not drink more than 2 alcoholic drinks per day.  See your doctor as told. GET HELP RIGHT AWAY IF:   You have a blood pressure reading with a top number of 180 or higher.  You get a very bad headache.  You get blurred or changing vision.  You feel confused.  You feel weak, numb, or faint.  You get chest or belly (abdominal) pain.  You throw up (vomit).  You cannot breathe very well. MAKE SURE YOU:   Understand these instructions.  Will watch your condition.  Will get help right away if you are not doing well or get worse. Document Released: 01/31/2008 Document Revised: 11/06/2011 Document Reviewed: 01/31/2008 [facility] Patient Information 2014 [place], [place].

## 2014-02-05 NOTE — ED Provider Notes (Signed)
CSN: [id number]     Arrival date & time 02/05/14  1519 History   First MD Initiated Contact with Patient 02/05/14 1532     Chief Complaint  Patient presents with  . Dental Pain     (Consider location/radiation/quality/duration/timing/severity/associated sxs/prior Treatment) HPI Comments: Patient with multiple caries and diffuse ttp.  Taking amoxicillin and states out of pain meds.  Discussed with patient tramadol #180 filled 5/19- patient states for back pain.  I discussed with him the systemic nature of pain meds and that he could add ibuprofen.  States toradol makes him feel funny- no s/s/ allergic symptoms.    Patient is a 23 y.o. male presenting with tooth pain. The history is provided by the patient.  Dental Pain Location:  Lower Lower teeth location:  17/LL 3rd molar Quality:  Dull Severity:  Moderate Onset quality:  Gradual Timing:  Constant Chronicity:  Recurrent Context: poor dentition   Context: not abscess, cap still on, not crown fracture and not dental caries   Prior workup: Patient seen here six days ago and started on amoxicillin and referred for follow up. Relieved by: vicodin. Worsened by:  Nothing tried Ineffective treatments: amoxicillin. Associated symptoms: facial swelling   Associated symptoms: no congestion, no difficulty swallowing, no drooling, no fever, no gum swelling, no neck swelling and no oral bleeding     Past Medical History  Diagnosis Date  . Renal disorder   . Hyperlipemia   . Kidney stone   . Back pain    Past Surgical History  Procedure Laterality Date  . Tonsillectomy    . Shoulder surgery    . Adenoidectomy     No family history on file. History  Substance Use Topics  . Smoking status: Current Every Day Smoker -- 1.00 packs/day    Types: Cigarettes  . Smokeless tobacco: Not on file  . Alcohol Use: No    Review of Systems  Constitutional: Negative for fever.  HENT: Positive for facial swelling. Negative for congestion and  drooling.   All other systems reviewed and are negative.     Allergies  Ketorolac tromethamine  Home Medications   Prior to Admission medications   Medication Sig Start Date End Date Taking? Authorizing Provider  amoxicillin (AMOXIL) 500 MG capsule Take 1 capsule (500 mg total) by mouth 3 (three) times daily. 01/30/14 02/09/14  Elson AreasLeslie K Sofia, PA-C  clindamycin (CLEOCIN) 150 MG capsule Take 1 capsule (150 mg total) by mouth 3 (three) times daily. 06/10/13   John L Molpus, MD  cyclobenzaprine (FLEXERIL) 10 MG tablet Take 1 tablet (10 mg total) by mouth 2 (two) times daily as needed for muscle spasms. 08/05/13   Dagmar HaitWilliam Blair Walden, MD  HYDROcodone-acetaminophen (NORCO/VICODIN) 5-325 MG per tablet Take 2 tablets by mouth every 4 (four) hours as needed for pain. 07/09/12   Nelia Shiobert L Beaton, MD  HYDROcodone-acetaminophen (NORCO/VICODIN) 5-325 MG per tablet Take 1-2 tablets by mouth every 6 (six) hours as needed for pain. 06/10/13   Carlisle BeersJohn L Molpus, MD  HYDROcodone-acetaminophen (NORCO/VICODIN) 5-325 MG per tablet Take 1 tablet by mouth every 6 (six) hours as needed for moderate pain. 08/05/13   Dagmar HaitWilliam Blair Walden, MD  HYDROcodone-acetaminophen (NORCO/VICODIN) 5-325 MG per tablet Take 2 tablets by mouth every 4 (four) hours as needed. 01/30/14   Elson AreasLeslie K Sofia, PA-C  ibuprofen (ADVIL,MOTRIN) 800 MG tablet Take 1 tablet (800 mg total) by mouth 3 (three) times daily. 05/15/13   Elson AreasLeslie K Sofia, PA-C  ibuprofen (ADVIL,MOTRIN) 800 MG  tablet Take 1 tablet (800 mg total) by mouth 3 (three) times daily. 01/30/14   Elson Areas, PA-C  oxyCODONE-acetaminophen (PERCOCET/ROXICET) 5-325 MG per tablet Take 2 tablets by mouth every 4 (four) hours as needed for pain. 05/15/13   Elson Areas, PA-C  penicillin v potassium (VEETID) 500 MG tablet Take 1 tablet (500 mg total) by mouth 3 (three) times daily. 07/09/12   Nelia Shi, MD   BP 141/88  Pulse 85  Temp(Src) 98.1 F (36.7 C) (Oral)  Resp 18  Ht 6\' 1"  (1.854  m)  Wt 265 lb (120.203 kg)  BMI 34.97 kg/m2  SpO2 98% Physical Exam  Nursing note and vitals reviewed. Constitutional: He is oriented to person, place, and time. He appears well-developed and well-nourished.  HENT:  Head: Normocephalic and atraumatic.  Right Ear: External ear normal.  Mouth/Throat: No oral lesions. No trismus in the jaw. Abnormal dentition. Dental caries present. No dental abscesses or uvula swelling.    Multiple caries.  Tooth ll third molar with severe decay- no gum swelling or discharge noted. No fluctuance with submandibular area normal.   Eyes: Conjunctivae are normal. Pupils are equal, round, and reactive to light.  Neck: Normal range of motion. Neck supple.  Cardiovascular: Normal rate.   Pulmonary/Chest: Effort normal.  Abdominal: Soft.  Neurological: He is alert and oriented to person, place, and time.  Skin: Skin is warm and dry.  Psychiatric: He has a normal mood and affect.    ED Course  Procedures (including critical care time) Labs Review Labs Reviewed - No data to display  Imaging Review No results found.   EKG Interpretation None      MDM   Final diagnoses:  Tooth pain  Dental decay      Hilario Quarry, MD 02/07/14 1725

## 2014-07-13 ENCOUNTER — Emergency Department (HOSPITAL_BASED_OUTPATIENT_CLINIC_OR_DEPARTMENT_OTHER)
Admission: EM | Admit: 2014-07-13 | Discharge: 2014-07-13 | Disposition: A | Discharge status: Home / Self Care | Payer: Medicaid Other | Attending: Emergency Medicine | Admitting: Emergency Medicine

## 2014-07-13 ENCOUNTER — Encounter (HOSPITAL_BASED_OUTPATIENT_CLINIC_OR_DEPARTMENT_OTHER): Payer: Self-pay

## 2014-07-13 DIAGNOSIS — G8929 Other chronic pain: Secondary | ICD-10-CM | POA: Insufficient documentation

## 2014-07-13 DIAGNOSIS — Z8639 Personal history of other endocrine, nutritional and metabolic disease: Secondary | ICD-10-CM | POA: Insufficient documentation

## 2014-07-13 DIAGNOSIS — M549 Dorsalgia, unspecified: Secondary | ICD-10-CM

## 2014-07-13 DIAGNOSIS — R110 Nausea: Secondary | ICD-10-CM | POA: Insufficient documentation

## 2014-07-13 DIAGNOSIS — M545 Low back pain, unspecified: Secondary | ICD-10-CM

## 2014-07-13 DIAGNOSIS — Z87448 Personal history of other diseases of urinary system: Secondary | ICD-10-CM | POA: Insufficient documentation

## 2014-07-13 DIAGNOSIS — Z87442 Personal history of urinary calculi: Secondary | ICD-10-CM | POA: Insufficient documentation

## 2014-07-13 DIAGNOSIS — Z87891 Personal history of nicotine dependence: Secondary | ICD-10-CM | POA: Insufficient documentation

## 2014-07-13 MED ORDER — IBUPROFEN 800 MG PO TABS
800.0000 mg | ORAL_TABLET | Freq: Once | ORAL | Status: AC
  Administered 2014-07-13: 800 mg via ORAL
  Filled 2014-07-13: qty 1

## 2014-07-13 MED ORDER — TRAMADOL HCL 50 MG PO TABS: 50.0000 mg | ORAL_TABLET | Freq: Four times a day (QID) | ORAL | Status: DC | PRN

## 2014-07-13 MED ORDER — ONDANSETRON 4 MG PO TBDP
4.0000 mg | ORAL_TABLET | Freq: Once | ORAL | Status: AC
  Administered 2014-07-13: 4 mg via ORAL

## 2014-07-13 MED ORDER — ONDANSETRON 4 MG PO TBDP
ORAL_TABLET | ORAL | Status: AC
  Filled 2014-07-13: qty 1

## 2014-07-13 NOTE — ED Notes (Signed)
Mid/lower back pain after unloading wood this am-steady gait into triage

## 2014-07-13 NOTE — ED Provider Notes (Signed)
CSN: [id number]     Arrival date & time 07/13/14  1254 History   First MD Initiated Contact with Patient 07/13/14 1308     Chief Complaint  Patient presents with  . Back Pain      Patient is a 23 y.o. male presenting with back pain. The history is provided by the patient.  Back Pain Location:  Lumbar spine Quality:  Aching Radiates to:  Does not radiate Pain severity:  Moderate Onset quality:  Sudden Duration:  2 hours Timing:  Constant Progression:  Worsening Chronicity:  Recurrent Relieved by:  Nothing Worsened by:  Movement Associated symptoms: no abdominal pain, no bladder incontinence, no bowel incontinence, no chest pain, no dysuria, no fever and no weakness   Risk factors: no recent surgery   Patient reports h/o chronic back pain Today, he was unloading wood and had onset of low back pain (usually has upper back pain) No falls/trauma No LE weakness No incontinence No cp/sob 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 Renal disorder   . Hyperlipemia   . Kidney stone   . Back pain    Past Surgical History  Procedure Laterality Date  . Tonsillectomy    . Shoulder surgery    . Adenoidectomy     No family history on file. History  Substance Use Topics  . Smoking status: Former Smoker -- 0.00 packs/day  . Smokeless tobacco: Not on file  . Alcohol Use: No    Review of Systems  Constitutional: Negative for fever.  Cardiovascular: Negative for chest pain.  Gastrointestinal: Positive for nausea. Negative for abdominal pain and bowel incontinence.  Genitourinary: Negative for bladder incontinence and dysuria.  Musculoskeletal: Positive for back pain.  Neurological: Negative for weakness.  All other systems reviewed and are negative.     Allergies  Ketorolac tromethamine  Home Medications   Prior to Admission medications   Medication Sig Start Date End Date Taking?  Authorizing Provider  traMADol (ULTRAM) 50 MG tablet Take 1 tablet (50 mg total) by mouth every 6 (six) hours as needed. 07/13/14   Joya Gaskinsonald W Trinnity Breunig, MD   BP 157/75 mmHg  Pulse 93  Temp(Src) 98.8 F (37.1 C) (Oral)  Resp 18  Ht 6\' 1"  (1.854 m)  Wt 255 lb (115.667 kg)  BMI 33.65 kg/m2  SpO2 99% Physical Exam CONSTITUTIONAL: Well developed/well nourished HEAD: Normocephalic/atraumatic EYES: EOMI/PERRL ENMT: Mucous membranes moist NECK: supple no meningeal signs SPINE/BACK:thoracic kyphosis noted.  He has lumbar tenderness. No bruising/crepitance/stepoffs noted to spine CV: S1/S2 noted, no murmurs/rubs/gallops noted LUNGS: Lungs are clear to auscultation bilaterally, no apparent distress ABDOMEN: soft, nontender, no rebound or guarding GU:no cva tenderness NEURO: Awake/alert,equal motor 5/5 strength noted with the following: hip flexion/knee flexion/extension, foot dorsi/plantar flexion, great toe extension intact bilaterally, no clonus bilaterally, plantar reflex appropriate (toes downgoing), no sensory deficit in any dermatome.  Equal patellar/achilles reflex noted (2+) in bilateral lower extremities.  Pt is able to ambulate unassisted. 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

## 2014-07-13 NOTE — Discharge Instructions (Signed)

## 2014-10-26 ENCOUNTER — Encounter (HOSPITAL_BASED_OUTPATIENT_CLINIC_OR_DEPARTMENT_OTHER): Payer: Self-pay | Admitting: Emergency Medicine

## 2014-10-26 ENCOUNTER — Emergency Department (HOSPITAL_BASED_OUTPATIENT_CLINIC_OR_DEPARTMENT_OTHER)
Admission: EM | Admit: 2014-10-26 | Discharge: 2014-10-26 | Disposition: A | Discharge status: Home / Self Care | Payer: Medicaid Other | Attending: Emergency Medicine | Admitting: Emergency Medicine

## 2014-10-26 DIAGNOSIS — Z720 Tobacco use: Secondary | ICD-10-CM | POA: Insufficient documentation

## 2014-10-26 DIAGNOSIS — R197 Diarrhea, unspecified: Secondary | ICD-10-CM | POA: Insufficient documentation

## 2014-10-26 DIAGNOSIS — R1110 Vomiting, unspecified: Secondary | ICD-10-CM

## 2014-10-26 DIAGNOSIS — R112 Nausea with vomiting, unspecified: Secondary | ICD-10-CM | POA: Insufficient documentation

## 2014-10-26 DIAGNOSIS — Z8639 Personal history of other endocrine, nutritional and metabolic disease: Secondary | ICD-10-CM | POA: Insufficient documentation

## 2014-10-26 DIAGNOSIS — R1084 Generalized abdominal pain: Secondary | ICD-10-CM | POA: Insufficient documentation

## 2014-10-26 DIAGNOSIS — R630 Anorexia: Secondary | ICD-10-CM | POA: Insufficient documentation

## 2014-10-26 DIAGNOSIS — Z87442 Personal history of urinary calculi: Secondary | ICD-10-CM | POA: Insufficient documentation

## 2014-10-26 DIAGNOSIS — Z87448 Personal history of other diseases of urinary system: Secondary | ICD-10-CM | POA: Insufficient documentation

## 2014-10-26 LAB — COMPREHENSIVE METABOLIC PANEL
ALBUMIN: 4.4 g/dL (ref 3.5–5.2)
ALK PHOS: 77 U/L (ref 39–117)
ALT: 25 U/L (ref 0–53)
ANION GAP: 2 — AB (ref 5–15)
AST: 23 U/L (ref 0–37)
BILIRUBIN TOTAL: 0.6 mg/dL (ref 0.3–1.2)
BUN: 8 mg/dL (ref 6–23)
CHLORIDE: 110 mmol/L (ref 96–112)
CO2: 26 mmol/L (ref 19–32)
Calcium: 8.9 mg/dL (ref 8.4–10.5)
Creatinine, Ser: 0.84 mg/dL (ref 0.50–1.35)
GFR calc non Af Amer: >90 mL/min (ref >90)
Glucose, Bld: 96 mg/dL (ref 70–99)
POTASSIUM: 3.8 mmol/L (ref 3.5–5.1)
Sodium: 138 mmol/L (ref 135–145)
TOTAL PROTEIN: 7.3 g/dL (ref 6.0–8.3)

## 2014-10-26 LAB — CBC WITH DIFFERENTIAL/PLATELET
BASOS ABS: 0.1 10*3/uL (ref 0.0–0.1)
BASOS PCT: 1 % (ref 0–1)
Eosinophils Absolute: 0.1 10*3/uL (ref 0.0–0.7)
Eosinophils Relative: 2 % (ref 0–5)
HCT: 41.1 % (ref 39.0–52.0)
HEMOGLOBIN: 14 g/dL (ref 13.0–17.0)
Lymphocytes Relative: 25 % (ref 12–46)
Lymphs Abs: 1.6 10*3/uL (ref 0.7–4.0)
MCH: 28.3 pg (ref 26.0–34.0)
MCHC: 34.1 g/dL (ref 30.0–36.0)
MCV: 83.2 fL (ref 78.0–100.0)
Monocytes Absolute: 0.5 10*3/uL (ref 0.1–1.0)
Monocytes Relative: 8 % (ref 3–12)
NEUTROS ABS: 4.2 10*3/uL (ref 1.7–7.7)
NEUTROS PCT: 64 % (ref 43–77)
Platelets: 305 10*3/uL (ref 150–400)
RBC: 4.94 MIL/uL (ref 4.22–5.81)
RDW: 13.2 % (ref 11.5–15.5)
WBC: 6.6 10*3/uL (ref 4.0–10.5)

## 2014-10-26 LAB — LIPASE, BLOOD: Lipase: 26 U/L (ref 11–59)

## 2014-10-26 MED ORDER — ONDANSETRON HCL 4 MG/2ML IJ SOLN
4.0000 mg | Freq: Once | INTRAMUSCULAR | Status: AC
  Administered 2014-10-26: 4 mg via INTRAMUSCULAR
  Filled 2014-10-26: qty 2

## 2014-10-26 MED ORDER — SODIUM CHLORIDE 0.9 % IV BOLUS (SEPSIS)
1000.0000 mL | Freq: Once | INTRAVENOUS | Status: AC
  Administered 2014-10-26: 1000 mL via INTRAVENOUS

## 2014-10-26 MED ORDER — ONDANSETRON 4 MG PO TBDP: 4.0000 mg | ORAL_TABLET | Freq: Three times a day (TID) | ORAL | Status: DC | PRN

## 2014-10-26 NOTE — ED Notes (Signed)
Pt having abdominal pain since 7 am.  Some N/V/D.  No fever.  No dysuria.

## 2014-10-26 NOTE — ED Provider Notes (Signed)
CSN: [id number]     Arrival date & time 10/26/14  1742 History   First MD Initiated Contact with Patient 10/26/14 1851     Chief Complaint  Patient presents with  . Abdominal Pain     (Consider location/radiation/quality/duration/timing/severity/associated sxs/prior Treatment) Patient is a 24 y.o. male presenting with abdominal pain. The history is provided by the patient. No language interpreter was used.  Abdominal Pain Pain location:  Generalized Pain quality: aching   Pain severity:  Moderate Onset quality:  Gradual Duration:  1 day Timing:  Constant Progression:  Worsening Chronicity:  New Context: suspicious food intake   Relieved by:  Nothing Ineffective treatments:  None tried Associated symptoms: anorexia, diarrhea, nausea and vomiting   Associated symptoms: no fever   Risk factors: no alcohol abuse   Pt complains of abdominal pain since yesterday after eating [place] food. Pt reports he has had multiple episodes of vomitting and diarrhea.   Past Medical History  Diagnosis Date  . Renal disorder   . Hyperlipemia   . Kidney stone   . Back pain    Past Surgical History  Procedure Laterality Date  . Tonsillectomy    . Shoulder surgery    . Adenoidectomy     No family history on file. History  Substance Use Topics  . Smoking status: Current Every Day Smoker -- 0.00 packs/day  . Smokeless tobacco: Not on file  . Alcohol Use: No    Review of Systems  Constitutional: Negative for fever.  Gastrointestinal: Positive for nausea, vomiting, abdominal pain, diarrhea and anorexia.  All other systems reviewed and are negative.     Allergies  Ketorolac tromethamine  Home Medications   Prior to Admission medications   Not on File   BP 98/55 mmHg  Pulse 78  Temp(Src) 98.8 F (37.1 C)  Resp 16  Ht  (1.854 m)  Wt 265 lb (120.203 kg)  BMI 34.97 kg/m2  SpO2 99% Physical Exam  Constitutional: He is oriented to person, place, and time. He appears  well-developed and well-nourished.  HENT:  Head: Normocephalic and atraumatic.  Right Ear: External ear normal.  Left Ear: External ear normal.  Nose: Nose normal.  Mouth/Throat: Oropharynx is clear and moist.  Eyes: Conjunctivae and EOM are normal. Pupils are equal, round, and reactive to light.  Neck: Normal range of motion.  Cardiovascular: Normal rate and normal heart sounds.   Pulmonary/Chest: Effort normal.  Abdominal: Soft. He exhibits no distension.  Musculoskeletal: Normal range of motion.  Neurological: He is alert and oriented to person, place, and time.  Skin: Skin is warm.  Psychiatric: He has a normal mood and affect.  Nursing note and vitals reviewed.   ED Course  Procedures (including critical care time) Labs Review Labs Reviewed  COMPREHENSIVE METABOLIC PANEL - Abnormal; Notable for the following:    Anion gap 2 (*)    All other components within normal limits  CBC WITH DIFFERENTIAL/PLATELET  LIPASE, BLOOD    Imaging Review No results found.   EKG Interpretation None     Results for orders placed or performed during the hospital encounter of 10/26/14  CBC with Differential/Platelet  Result Value Ref Range   WBC 6.6 4.0 - 10.5 K/uL   RBC 4.94 4.22 - 5.81 MIL/uL   Hemoglobin 14.0 13.0 - 17.0 g/dL   HCT 40.9 81.1 - 91.4 %   MCV 83.2 78.0 - 100.0 fL   MCH 28.3 26.0 - 34.0 pg   MCHC 34.1 30.0 -  36.0 g/dL   RDW 40.913.2 81.111.5 - 91.415.5 %   Platelets 305 150 - 400 K/uL   Neutrophils Relative % 64 43 - 77 %   Neutro Abs 4.2 1.7 - 7.7 K/uL   Lymphocytes Relative 25 12 - 46 %   Lymphs Abs 1.6 0.7 - 4.0 K/uL   Monocytes Relative 8 3 - 12 %   Monocytes Absolute 0.5 0.1 - 1.0 K/uL   Eosinophils Relative 2 0 - 5 %   Eosinophils Absolute 0.1 0.0 - 0.7 K/uL   Basophils Relative 1 0 - 1 %   Basophils Absolute 0.1 0.0 - 0.1 K/uL  Comprehensive metabolic panel  Result Value Ref Range   Sodium 138 135 - 145 mmol/L   Potassium 3.8 3.5 - 5.1 mmol/L   Chloride 110 96  - 112 mmol/L   CO2 26 19 - 32 mmol/L   Glucose, Bld 96 70 - 99 mg/dL   BUN 8 6 - 23 mg/dL   Creatinine, Ser 7.820.84 0.50 - 1.35 mg/dL   Calcium 8.9 8.4 - 95.610.5 mg/dL   Total Protein 7.3 6.0 - 8.3 g/dL   Albumin 4.4 3.5 - 5.2 g/dL   AST 23 0 - 37 U/L   ALT 25 0 - 53 U/L   Alkaline Phosphatase 77 39 - 117 U/L   Total Bilirubin 0.6 0.3 - 1.2 mg/dL   GFR calc non Af Amer >90 >90 mL/min   GFR calc Af Amer >90 >90 mL/min   Anion gap 2 (L) 5 - 15  Lipase, blood  Result Value Ref Range   Lipase 26 11 - 59 U/L   No results found.  MDM   Final diagnoses:  Vomiting and diarrhea    Pt given Iv fluids x 1 liter.  Pt reports he feels much better after fluids and zofran.      Elson AreasLeslie K Sofia, PA-C 10/26/14 2008  Purvis SheffieldForrest Harrison, MD 10/27/14 1045

## 2014-10-26 NOTE — Discharge Instructions (Signed)

## 2014-10-29 ENCOUNTER — Emergency Department (HOSPITAL_BASED_OUTPATIENT_CLINIC_OR_DEPARTMENT_OTHER)
Admission: EM | Admit: 2014-10-29 | Discharge: 2014-10-29 | Disposition: A | Discharge status: Home / Self Care | Payer: Self-pay | Attending: Emergency Medicine | Admitting: Emergency Medicine

## 2014-10-29 ENCOUNTER — Encounter (HOSPITAL_BASED_OUTPATIENT_CLINIC_OR_DEPARTMENT_OTHER): Payer: Self-pay

## 2014-10-29 DIAGNOSIS — R112 Nausea with vomiting, unspecified: Secondary | ICD-10-CM | POA: Insufficient documentation

## 2014-10-29 DIAGNOSIS — R1010 Upper abdominal pain, unspecified: Secondary | ICD-10-CM

## 2014-10-29 DIAGNOSIS — Z720 Tobacco use: Secondary | ICD-10-CM | POA: Insufficient documentation

## 2014-10-29 DIAGNOSIS — R197 Diarrhea, unspecified: Secondary | ICD-10-CM | POA: Insufficient documentation

## 2014-10-29 DIAGNOSIS — R1011 Right upper quadrant pain: Secondary | ICD-10-CM | POA: Insufficient documentation

## 2014-10-29 DIAGNOSIS — Z87448 Personal history of other diseases of urinary system: Secondary | ICD-10-CM | POA: Insufficient documentation

## 2014-10-29 DIAGNOSIS — Z8639 Personal history of other endocrine, nutritional and metabolic disease: Secondary | ICD-10-CM | POA: Insufficient documentation

## 2014-10-29 DIAGNOSIS — Z87442 Personal history of urinary calculi: Secondary | ICD-10-CM | POA: Insufficient documentation

## 2014-10-29 LAB — COMPREHENSIVE METABOLIC PANEL
ALBUMIN: 4.2 g/dL (ref 3.5–5.2)
ALK PHOS: 68 U/L (ref 39–117)
ALT: 18 U/L (ref 0–53)
AST: 18 U/L (ref 0–37)
Anion gap: 1 — ABNORMAL LOW (ref 5–15)
BILIRUBIN TOTAL: 0.9 mg/dL (ref 0.3–1.2)
BUN: 7 mg/dL (ref 6–23)
CHLORIDE: 110 mmol/L (ref 96–112)
CO2: 25 mmol/L (ref 19–32)
Calcium: 8.6 mg/dL (ref 8.4–10.5)
Creatinine, Ser: 0.76 mg/dL (ref 0.50–1.35)
GFR calc Af Amer: >90 mL/min (ref >90)
GFR calc non Af Amer: >90 mL/min (ref >90)
Glucose, Bld: 95 mg/dL (ref 70–99)
POTASSIUM: 3.6 mmol/L (ref 3.5–5.1)
SODIUM: 136 mmol/L (ref 135–145)
TOTAL PROTEIN: 6.7 g/dL (ref 6.0–8.3)

## 2014-10-29 LAB — CBC WITH DIFFERENTIAL/PLATELET
BASOS PCT: 1 % (ref 0–1)
Basophils Absolute: 0 10*3/uL (ref 0.0–0.1)
Eosinophils Absolute: 0.2 10*3/uL (ref 0.0–0.7)
Eosinophils Relative: 2 % (ref 0–5)
HEMATOCRIT: 39 % (ref 39.0–52.0)
Hemoglobin: 13.3 g/dL (ref 13.0–17.0)
Lymphocytes Relative: 23 % (ref 12–46)
Lymphs Abs: 1.9 10*3/uL (ref 0.7–4.0)
MCH: 28.8 pg (ref 26.0–34.0)
MCHC: 34.1 g/dL (ref 30.0–36.0)
MCV: 84.4 fL (ref 78.0–100.0)
Monocytes Absolute: 0.6 10*3/uL (ref 0.1–1.0)
Monocytes Relative: 8 % (ref 3–12)
Neutro Abs: 5.3 10*3/uL (ref 1.7–7.7)
Neutrophils Relative %: 66 % (ref 43–77)
PLATELETS: 280 10*3/uL (ref 150–400)
RBC: 4.62 MIL/uL (ref 4.22–5.81)
RDW: 13 % (ref 11.5–15.5)
WBC: 8 10*3/uL (ref 4.0–10.5)

## 2014-10-29 LAB — LIPASE, BLOOD: Lipase: 26 U/L (ref 11–59)

## 2014-10-29 MED ORDER — ACETAMINOPHEN 500 MG PO TABS
1000.0000 mg | ORAL_TABLET | Freq: Once | ORAL | Status: AC
Start: 2014-10-29 — End: 2014-10-29
  Administered 2014-10-29: 1000 mg via ORAL
  Filled 2014-10-29: qty 2

## 2014-10-29 MED ORDER — SODIUM CHLORIDE 0.9 % IV BOLUS (SEPSIS)
1000.0000 mL | Freq: Once | INTRAVENOUS | Status: AC
  Administered 2014-10-29: 1000 mL via INTRAVENOUS

## 2014-10-29 MED ORDER — DICYCLOMINE HCL 20 MG PO TABS: 20.0000 mg | ORAL_TABLET | Freq: Two times a day (BID) | ORAL | Status: DC | PRN

## 2014-10-29 MED ORDER — PROMETHAZINE HCL 25 MG PO TABS: 25.0000 mg | ORAL_TABLET | Freq: Four times a day (QID) | ORAL | Status: DC | PRN

## 2014-10-29 MED ORDER — KETOROLAC TROMETHAMINE 30 MG/ML IJ SOLN
30.0000 mg | Freq: Once | INTRAMUSCULAR | Status: DC
  Filled 2014-10-29: qty 1

## 2014-10-29 MED ORDER — LOPERAMIDE HCL 2 MG PO CAPS: 2.0000 mg | ORAL_CAPSULE | Freq: Four times a day (QID) | ORAL | Status: DC | PRN

## 2014-10-29 MED ORDER — METOCLOPRAMIDE HCL 5 MG/ML IJ SOLN
10.0000 mg | Freq: Once | INTRAMUSCULAR | Status: AC
  Administered 2014-10-29: 10 mg via INTRAVENOUS
  Filled 2014-10-29: qty 2

## 2014-10-29 MED ORDER — LOPERAMIDE HCL 2 MG PO CAPS
4.0000 mg | ORAL_CAPSULE | Freq: Once | ORAL | Status: AC
  Administered 2014-10-29: 4 mg via ORAL
  Filled 2014-10-29: qty 2

## 2014-10-29 NOTE — ED Notes (Signed)
Pt refused toradol, makes him "feel fuzzy".

## 2014-10-29 NOTE — ED Notes (Signed)
Pt states nausea and diarrhea has not improved since being seen here for same on Monday.  Pt states continued abdominal cramping and discomfort same as Monday.  Pt able to eat and drink small amounts but feels sick after.

## 2014-10-29 NOTE — ED Provider Notes (Signed)
CSN: [id number]     Arrival date & time 10/29/14  1330 History   First MD Initiated Contact with Patient 10/29/14 1540     Chief Complaint  Patient presents with  . Abdominal Pain     (Consider location/radiation/quality/duration/timing/severity/associated sxs/prior Treatment) The history is provided by the patient and medical records.     Patient presents with 4 days of abdominal pain, N/V/D.  Seen in ED 4 days ago and was given zofran, which he states only helps for about 30 minutes.  Emesis is clear liquid or contents of his stomach.  Diarrhea is described as loose, nonbloody.  Abdominal pain is upper abdominal pain, worse after eating and before vomiting.  Has had multiple sick contacts with similar symptoms.  Denies fevers.  No hx abdominal surgeries.    Past Medical History  Diagnosis Date  . Renal disorder   . Hyperlipemia   . Kidney stone   . Back pain    Past Surgical History  Procedure Laterality Date  . Tonsillectomy    . Shoulder surgery    . Adenoidectomy     No family history on file. History  Substance Use Topics  . Smoking status: Current Every Day Smoker -- 0.00 packs/day  . Smokeless tobacco: Not on file  . Alcohol Use: No    Review of Systems  All other systems reviewed and are negative.     Allergies  Ketorolac tromethamine  Home Medications   Prior to Admission medications   Medication Sig Start Date End Date Taking? Authorizing Provider  ondansetron (ZOFRAN ODT) 4 MG disintegrating tablet Take 1 tablet (4 mg total) by mouth every 8 (eight) hours as needed for nausea or vomiting. 10/26/14   Elson AreasLeslie K Sofia, PA-C   BP 129/95 mmHg  Pulse 66  Temp(Src) 98 F (36.7 C) (Oral)  Resp 16  Ht 6\' 1"  (1.854 m)  Wt 270 lb (122.471 kg)  BMI 35.63 kg/m2  SpO2 100% Physical Exam  Constitutional: He appears well-developed and well-nourished. No distress.  HENT:  Head: Normocephalic and atraumatic.  Neck: Neck supple.  Cardiovascular: Normal rate and  regular rhythm.   Pulmonary/Chest: Effort normal and breath sounds normal. No respiratory distress. He has no wheezes. He has no rales.  Abdominal: Soft. He exhibits no distension and no mass. There is tenderness in the right upper quadrant and epigastric area. There is no rebound and no guarding.  Neurological: He is alert. He exhibits normal muscle tone.  Skin: He is not diaphoretic.  Nursing note and vitals reviewed.   ED Course  Procedures (including critical care time) Labs Review Labs Reviewed  COMPREHENSIVE METABOLIC PANEL - Abnormal; Notable for the following:    Anion gap 1 (*)    All other components within normal limits  LIPASE, BLOOD  CBC WITH DIFFERENTIAL/PLATELET    Imaging Review No results found.   EKG Interpretation None      MDM   Final diagnoses:  Nausea vomiting and diarrhea  Pain of upper abdomen    Afebrile, nontoxic patient with abdominal pain, N/V/D x 4 days.  Zofran not helping at home.  IVF, toradol, reglan given in ED.  Labs unremarkable.  D/C home with bentyl, imodium, phenergan.  Discussed result, findings, treatment, and follow up  with patient.  Pt given return precautions.  Pt verbalizes understanding and agrees with plan.         Trixie Dredgemily Cope Marte, PA-C 10/29/14 1758  Pricilla LovelessScott Goldston, MD 11/03/14 [phone]

## 2014-10-29 NOTE — ED Notes (Signed)
Reports continued vomiting, nausea, abdominal pain and diarrhea. Reports no relief with zofran at home.

## 2014-10-29 NOTE — Discharge Instructions (Signed)
Read the information below.  Use the prescribed medication as directed.  Please discuss all new medications with your pharmacist.  You may return to the Emergency Department at any time for worsening condition or any new symptoms that concern you.   If you develop high fevers, worsening abdominal pain, uncontrolled vomiting, or are unable to tolerate fluids by mouth, return to the ER for a recheck.  ° °

## 2014-12-07 ENCOUNTER — Emergency Department (HOSPITAL_BASED_OUTPATIENT_CLINIC_OR_DEPARTMENT_OTHER)
Admission: EM | Admit: 2014-12-07 | Discharge: 2014-12-07 | Disposition: A | Discharge status: Home / Self Care | Payer: Self-pay | Attending: Emergency Medicine | Admitting: Emergency Medicine

## 2014-12-07 ENCOUNTER — Encounter (HOSPITAL_BASED_OUTPATIENT_CLINIC_OR_DEPARTMENT_OTHER): Payer: Self-pay | Admitting: *Deleted

## 2014-12-07 ENCOUNTER — Emergency Department (HOSPITAL_BASED_OUTPATIENT_CLINIC_OR_DEPARTMENT_OTHER): Payer: Self-pay

## 2014-12-07 DIAGNOSIS — K529 Noninfective gastroenteritis and colitis, unspecified: Secondary | ICD-10-CM

## 2014-12-07 DIAGNOSIS — Z87448 Personal history of other diseases of urinary system: Secondary | ICD-10-CM | POA: Insufficient documentation

## 2014-12-07 DIAGNOSIS — R109 Unspecified abdominal pain: Secondary | ICD-10-CM

## 2014-12-07 DIAGNOSIS — Z8639 Personal history of other endocrine, nutritional and metabolic disease: Secondary | ICD-10-CM | POA: Insufficient documentation

## 2014-12-07 DIAGNOSIS — Z87442 Personal history of urinary calculi: Secondary | ICD-10-CM | POA: Insufficient documentation

## 2014-12-07 DIAGNOSIS — R1013 Epigastric pain: Secondary | ICD-10-CM

## 2014-12-07 DIAGNOSIS — Z720 Tobacco use: Secondary | ICD-10-CM | POA: Insufficient documentation

## 2014-12-07 LAB — CBC WITH DIFFERENTIAL/PLATELET
BASOS ABS: 0 10*3/uL (ref 0.0–0.1)
Basophils Relative: 1 % (ref 0–1)
Eosinophils Absolute: 0.2 10*3/uL (ref 0.0–0.7)
Eosinophils Relative: 2 % (ref 0–5)
HCT: 40.5 % (ref 39.0–52.0)
HEMOGLOBIN: 14 g/dL (ref 13.0–17.0)
LYMPHS PCT: 26 % (ref 12–46)
Lymphs Abs: 1.9 10*3/uL (ref 0.7–4.0)
MCH: 28.8 pg (ref 26.0–34.0)
MCHC: 34.6 g/dL (ref 30.0–36.0)
MCV: 83.3 fL (ref 78.0–100.0)
MONO ABS: 0.6 10*3/uL (ref 0.1–1.0)
Monocytes Relative: 8 % (ref 3–12)
NEUTROS ABS: 4.6 10*3/uL (ref 1.7–7.7)
Neutrophils Relative %: 63 % (ref 43–77)
PLATELETS: 311 10*3/uL (ref 150–400)
RBC: 4.86 MIL/uL (ref 4.22–5.81)
RDW: 13 % (ref 11.5–15.5)
WBC: 7.4 10*3/uL (ref 4.0–10.5)

## 2014-12-07 LAB — COMPREHENSIVE METABOLIC PANEL
ALBUMIN: 4.3 g/dL (ref 3.5–5.2)
ALK PHOS: 72 U/L (ref 39–117)
ALT: 18 U/L (ref 0–53)
ANION GAP: 8 (ref 5–15)
AST: 18 U/L (ref 0–37)
BILIRUBIN TOTAL: 0.1 mg/dL — AB (ref 0.3–1.2)
BUN: 8 mg/dL (ref 6–23)
CHLORIDE: 107 mmol/L (ref 96–112)
CO2: 26 mmol/L (ref 19–32)
Calcium: 9.2 mg/dL (ref 8.4–10.5)
Creatinine, Ser: 0.88 mg/dL (ref 0.50–1.35)
GFR calc Af Amer: >90 mL/min (ref >90)
GFR calc non Af Amer: >90 mL/min (ref >90)
GLUCOSE: 105 mg/dL — AB (ref 70–99)
POTASSIUM: 3.8 mmol/L (ref 3.5–5.1)
SODIUM: 141 mmol/L (ref 135–145)
Total Protein: 6.8 g/dL (ref 6.0–8.3)

## 2014-12-07 MED ORDER — MORPHINE SULFATE 4 MG/ML IJ SOLN
4.0000 mg | Freq: Once | INTRAMUSCULAR | Status: AC
  Administered 2014-12-07: 4 mg via INTRAVENOUS
  Filled 2014-12-07: qty 1

## 2014-12-07 MED ORDER — ONDANSETRON HCL 4 MG/2ML IJ SOLN
4.0000 mg | Freq: Once | INTRAMUSCULAR | Status: AC
  Administered 2014-12-07: 4 mg via INTRAVENOUS
  Filled 2014-12-07: qty 2

## 2014-12-07 MED ORDER — IOHEXOL 300 MG/ML  SOLN
100.0000 mL | Freq: Once | INTRAMUSCULAR | Status: AC | PRN
  Administered 2014-12-07: 100 mL via INTRAVENOUS

## 2014-12-07 MED ORDER — IOHEXOL 300 MG/ML  SOLN
25.0000 mL | Freq: Once | INTRAMUSCULAR | Status: AC | PRN
  Administered 2014-12-07: 25 mL via ORAL

## 2014-12-07 MED ORDER — SODIUM CHLORIDE 0.9 % IV BOLUS (SEPSIS)
1000.0000 mL | Freq: Once | INTRAVENOUS | Status: AC
  Administered 2014-12-07: 1000 mL via INTRAVENOUS

## 2014-12-07 NOTE — Discharge Instructions (Signed)

## 2014-12-07 NOTE — ED Provider Notes (Signed)
CSN: [id number]     Arrival date & time 12/07/14  [id number] History   First MD Initiated Contact with Patient 12/07/14 [phone]     Chief Complaint  Patient presents with  . Diarrhea     (Consider location/radiation/quality/duration/timing/severity/associated sxs/prior Treatment) HPI Comments: Patient is a 24 year old male with history of high cholesterol and renal calculi. He presents today with complaints of diarrhea for the past 2 days. He also reports generalized abdominal cramping but denies nausea or vomiting. He denies fevers or chills. His diarrhea has been nonbloody and he does report that it appears dark.  Patient is a 24 y.o. male presenting with diarrhea. The history is provided by the patient.  Diarrhea Quality:  Watery Severity:  Moderate Duration:  2 days Timing:  Constant Progression:  Worsening Relieved by:  Nothing Worsened by:  Nothing tried Ineffective treatments:  None tried Associated symptoms: abdominal pain   Associated symptoms: no chills and no fever     Past Medical History  Diagnosis Date  . Renal disorder   . Hyperlipemia   . Kidney stone   . Back pain    Past Surgical History  Procedure Laterality Date  . Tonsillectomy    . Shoulder surgery    . Adenoidectomy     No family history on file. History  Substance Use Topics  . Smoking status: Current Every Day Smoker -- 0.50 packs/day    Types: Cigarettes  . Smokeless tobacco: Not on file  . Alcohol Use: No    Review of Systems  Constitutional: Negative for fever and chills.  Gastrointestinal: Positive for abdominal pain and diarrhea.  All other systems reviewed and are negative.     Allergies  Ketorolac tromethamine  Home Medications   Prior to Admission medications   Not on File   BP 143/84 mmHg  Pulse 76  Temp(Src) 98.1 F (36.7 C) (Oral)  Resp 18  Ht 6' 0.5" (1.842 m)  Wt 285 lb (129.275 kg)  BMI 38.10 kg/m2  SpO2 99% Physical Exam  Constitutional: He is oriented to person,  place, and time. He appears well-developed and well-nourished. No distress.  HENT:  Head: Normocephalic and atraumatic.  Mouth/Throat: Oropharynx is clear and moist.  Neck: Normal range of motion. Neck supple.  Cardiovascular: Normal rate, regular rhythm and normal heart sounds.   No murmur heard. Pulmonary/Chest: Effort normal and breath sounds normal. No respiratory distress. He has no wheezes.  Abdominal: Soft. Bowel sounds are normal. He exhibits no distension. There is no tenderness. There is no rebound.  Musculoskeletal: Normal range of motion. He exhibits no edema.  Neurological: He is alert and oriented to person, place, and time.  Skin: Skin is warm and dry. He is not diaphoretic.  Nursing note and vitals reviewed.   ED Course  Procedures (including critical care time) Labs Review Labs Reviewed  COMPREHENSIVE METABOLIC PANEL  CBC WITH DIFFERENTIAL/PLATELET    Imaging Review No results found.   EKG Interpretation None      MDM   Final diagnoses:  None    Patient presents here with complaints of nausea, vomiting, and diarrhea for the past 2 days. His presentation, exam, and workup is consistent with a viral enteritis. He is feeling better with IV fluids and pain medication and I believe is appropriate for discharge. He will be given Zofran he can take as needed for nausea and is to return as needed if he worsens.    Geoffery Lyonsouglas Deshae Dickison, MD 12/07/14 1245

## 2014-12-07 NOTE — ED Notes (Signed)
C/o diarrhea x 2 days. C/o upper abd pain. C/o nausea no vomiting. States stools look dark.

## 2015-02-24 ENCOUNTER — Encounter (HOSPITAL_BASED_OUTPATIENT_CLINIC_OR_DEPARTMENT_OTHER): Payer: Self-pay

## 2015-02-24 ENCOUNTER — Emergency Department (HOSPITAL_BASED_OUTPATIENT_CLINIC_OR_DEPARTMENT_OTHER)
Admission: EM | Admit: 2015-02-24 | Discharge: 2015-02-24 | Disposition: A | Discharge status: Home / Self Care | Payer: Self-pay | Attending: Emergency Medicine | Admitting: Emergency Medicine

## 2015-02-24 DIAGNOSIS — Z8639 Personal history of other endocrine, nutritional and metabolic disease: Secondary | ICD-10-CM | POA: Insufficient documentation

## 2015-02-24 DIAGNOSIS — Z87448 Personal history of other diseases of urinary system: Secondary | ICD-10-CM | POA: Insufficient documentation

## 2015-02-24 DIAGNOSIS — R112 Nausea with vomiting, unspecified: Secondary | ICD-10-CM | POA: Insufficient documentation

## 2015-02-24 DIAGNOSIS — Z87891 Personal history of nicotine dependence: Secondary | ICD-10-CM | POA: Insufficient documentation

## 2015-02-24 DIAGNOSIS — R1012 Left upper quadrant pain: Secondary | ICD-10-CM | POA: Insufficient documentation

## 2015-02-24 DIAGNOSIS — R1013 Epigastric pain: Secondary | ICD-10-CM

## 2015-02-24 DIAGNOSIS — Z87442 Personal history of urinary calculi: Secondary | ICD-10-CM | POA: Insufficient documentation

## 2015-02-24 LAB — CBC WITH DIFFERENTIAL/PLATELET
BASOS ABS: 0 10*3/uL (ref 0.0–0.1)
BASOS PCT: 1 % (ref 0–1)
EOS PCT: 3 % (ref 0–5)
Eosinophils Absolute: 0.2 10*3/uL (ref 0.0–0.7)
HCT: 41.7 % (ref 39.0–52.0)
Hemoglobin: 14.5 g/dL (ref 13.0–17.0)
Lymphocytes Relative: 32 % (ref 12–46)
Lymphs Abs: 2.3 10*3/uL (ref 0.7–4.0)
MCH: 28.7 pg (ref 26.0–34.0)
MCHC: 34.8 g/dL (ref 30.0–36.0)
MCV: 82.4 fL (ref 78.0–100.0)
Monocytes Absolute: 0.6 10*3/uL (ref 0.1–1.0)
Monocytes Relative: 9 % (ref 3–12)
NEUTROS ABS: 4.1 10*3/uL (ref 1.7–7.7)
NEUTROS PCT: 55 % (ref 43–77)
Platelets: 293 10*3/uL (ref 150–400)
RBC: 5.06 MIL/uL (ref 4.22–5.81)
RDW: 12.8 % (ref 11.5–15.5)
WBC: 7.2 10*3/uL (ref 4.0–10.5)

## 2015-02-24 LAB — COMPREHENSIVE METABOLIC PANEL
ALK PHOS: 70 U/L (ref 38–126)
ALT: 24 U/L (ref 17–63)
AST: 20 U/L (ref 15–41)
Albumin: 4.1 g/dL (ref 3.5–5.0)
Anion gap: 7 (ref 5–15)
BUN: 8 mg/dL (ref 6–20)
CO2: 26 mmol/L (ref 22–32)
Calcium: 9.2 mg/dL (ref 8.9–10.3)
Chloride: 107 mmol/L (ref 101–111)
Creatinine, Ser: 0.78 mg/dL (ref 0.61–1.24)
GFR calc Af Amer: >60 mL/min (ref >60)
GFR calc non Af Amer: >60 mL/min (ref >60)
Glucose, Bld: 108 mg/dL — ABNORMAL HIGH (ref 65–99)
POTASSIUM: 3.6 mmol/L (ref 3.5–5.1)
SODIUM: 140 mmol/L (ref 135–145)
Total Bilirubin: 0.6 mg/dL (ref 0.3–1.2)
Total Protein: 6.8 g/dL (ref 6.5–8.1)

## 2015-02-24 LAB — LIPASE, BLOOD: LIPASE: 18 U/L — AB (ref 22–51)

## 2015-02-24 MED ORDER — GI COCKTAIL ~~LOC~~
30.0000 mL | Freq: Once | ORAL | Status: AC
  Administered 2015-02-24: 30 mL via ORAL
  Filled 2015-02-24: qty 30

## 2015-02-24 MED ORDER — RANITIDINE HCL 75 MG PO TABS: 75.0000 mg | ORAL_TABLET | Freq: Two times a day (BID) | ORAL | Status: DC

## 2015-02-24 NOTE — Discharge Instructions (Signed)
Continue Prevacid, add Zantac  Abdominal Pain Many things can cause abdominal pain. Usually, abdominal pain is not caused by a disease and will improve without treatment. It can often be observed and treated at home. Your health care provider will do a physical exam and possibly order blood tests and X-rays to help determine the seriousness of your pain. However, in many cases, more time must pass before a clear cause of the pain can be found. Before that point, your health care provider may not know if you need more testing or further treatment. HOME CARE INSTRUCTIONS  Monitor your abdominal pain for any changes. The following actions may help to alleviate any discomfort you are experiencing:  Only take over-the-counter or prescription medicines as directed by your health care provider.  Do not take laxatives unless directed to do so by your health care provider.  Try a clear liquid diet (broth, tea, or water) as directed by your health care provider. Slowly move to a bland diet as tolerated. SEEK MEDICAL CARE IF:  You have unexplained abdominal pain.  You have abdominal pain associated with nausea or diarrhea.  You have pain when you urinate or have a bowel movement.  You experience abdominal pain that wakes you in the night.  You have abdominal pain that is worsened or improved by eating food.  You have abdominal pain that is worsened with eating fatty foods.  You have a fever. SEEK IMMEDIATE MEDICAL CARE IF:   Your pain does not go away within 2 hours.  You keep throwing up (vomiting).  Your pain is felt only in portions of the abdomen, such as the right side or the left lower portion of the abdomen.  You pass bloody or black tarry stools. MAKE SURE YOU:  Understand these instructions.   Will watch your condition.   Will get help right away if you are not doing well or get worse.  Document Released: 05/24/2005 Document Revised: 08/19/2013 Document Reviewed:  04/23/2013 [facility] Patient Information 2015 [place], [place]. This information is not intended to replace advice given to you by your health care provider. Make sure you discuss any questions you have with your health care provider.

## 2015-02-24 NOTE — ED Provider Notes (Signed)
CSN: [id number]     Arrival date & time 02/24/15  1946 History   This chart was scribed for Andrew DivineJohn Davieon Stockham, MD by Arlan OrganAshley Leger, ED Scribe. This patient was seen in room MH09/MH09 and the patient's care was started 8:09 PM.   Chief Complaint  Patient presents with  . Abdominal Pain   Patient is a 24 y.o. male presenting with abdominal pain. The history is provided by the patient. No language interpreter was used.  Abdominal Pain Pain location:  Epigastric Pain quality: dull   Pain radiates to:  Does not radiate Pain severity:  Mild Duration:  1 week Timing:  Constant Progression:  Unchanged Chronicity:  New Context: not sick contacts and not suspicious food intake   Worsened by:  Bowel movements Associated symptoms: nausea and vomiting   Associated symptoms: no chills and no fever   Risk factors: no alcohol abuse     HPI Comments: Rosezetta Schlattererry W Rohde is a 24 y.o. male with a PMHx of renal disorder, hyperlipidemia, and kidney stones who presents to the Emergency Department complaining of constant, ongoing, unchanged abdominal pain x 1 week. Pain is described as dull. Discomfort is exacerbated after eating spicy food and when having a bowel movement. No alleviating factors at this time. Pt also reports resolving nausea and vomiting. Prescribed Prevacid, nausea medication, and Promethazine attempted prior to arrival with mild improvement for symptoms. No recent fever or chills. [facility]. At time of previous visits, pt was treated for indigestion. Most recently, Mr. Reino KentHood started on Prevacid 2 days ago. He is a former smoker; quit date 02/10/2015. Pt with known allergy to Ketorolac Tromethamine.  Past Medical History  Diagnosis Date  . Renal disorder   . Hyperlipemia   . Kidney stone   . Back pain    Past Surgical History  Procedure Laterality Date  . Tonsillectomy    . Shoulder surgery    . Adenoidectomy     No family history on  file. History  Substance Use Topics  . Smoking status: Former Smoker -- 0.50 packs/day    Types: Cigarettes    Quit date: 02/10/2015  . Smokeless tobacco: Not on file  . Alcohol Use: No    Review of Systems  Constitutional: Negative for fever and chills.  Gastrointestinal: Positive for nausea, vomiting and abdominal pain.  All other systems reviewed and are negative.     Allergies  Ketorolac tromethamine  Home Medications   Prior to Admission medications   Not on File   Triage Vitals: BP 128/85 mmHg  Pulse 88  Temp(Src) 98.5 F (36.9 C) (Oral)  Resp 20  Ht 6\' 1"  (1.854 m)  Wt 305 lb (138.347 kg)  BMI 40.25 kg/m2  SpO2 100%   Physical Exam  Constitutional: He is oriented to person, place, and time. He appears well-developed and well-nourished. No distress.  HENT:  Head: Normocephalic and atraumatic.  Eyes: Conjunctivae are normal. No scleral icterus.  Neck: Neck supple.  Cardiovascular: Normal rate and intact distal pulses.   Pulmonary/Chest: Effort normal. No stridor. No respiratory distress.  Abdominal: Normal appearance. He exhibits no distension. There is tenderness (no RUQ TTP) in the epigastric area and left upper quadrant. There is no rigidity, no rebound and no guarding.  Neurological: He is alert and oriented to person, place, and time.  Skin: Skin is warm and dry. No rash noted.  Psychiatric: He has a normal mood and affect. His behavior  is normal.  Nursing note and vitals reviewed.   ED Course  Procedures (including critical care time)  DIAGNOSTIC STUDIES: Oxygen Saturation is 100% on RA, Normal by my interpretation.    COORDINATION OF CARE: 8:10 PM- Will give Maalox. Will order CBC, CMP, and Lipase. Discussed treatment plan with pt at bedside and pt agreed to plan.    Labs Review Labs Reviewed  COMPREHENSIVE METABOLIC PANEL - Abnormal; Notable for the following:    Glucose, Bld 108 (*)    All other components within normal limits  LIPASE,  BLOOD - Abnormal; Notable for the following:    Lipase 18 (*)    All other components within normal limits  CBC WITH DIFFERENTIAL/PLATELET    Imaging Review No results found.   EKG Interpretation None      MDM   Final diagnoses:  Epigastric abdominal pain    Pt with epigastric and LUQ pain with symptoms consistent with gastritis or PUD.  He has started treatment with PPI and he is feeling better after two days.  He does not need CT or US imaging based on history and exam.  GI cocktail helped.  Will add zantac, but I suspect additional time on PPI will also help.    I personally performed the services described in this documentation, which was scribed in my presence. The recorded information has been reviewed and is accurate.     Andrew Divine, MD 02/25/15 [phone]

## 2015-02-24 NOTE — ED Notes (Signed)
abd pain x 1 week 

## 2015-05-15 DIAGNOSIS — Z8639 Personal history of other endocrine, nutritional and metabolic disease: Secondary | ICD-10-CM | POA: Insufficient documentation

## 2015-05-15 DIAGNOSIS — Z87891 Personal history of nicotine dependence: Secondary | ICD-10-CM | POA: Insufficient documentation

## 2015-05-15 DIAGNOSIS — Y92410 Unspecified street and highway as the place of occurrence of the external cause: Secondary | ICD-10-CM | POA: Insufficient documentation

## 2015-05-15 DIAGNOSIS — Y9389 Activity, other specified: Secondary | ICD-10-CM | POA: Insufficient documentation

## 2015-05-15 DIAGNOSIS — Z79899 Other long term (current) drug therapy: Secondary | ICD-10-CM | POA: Insufficient documentation

## 2015-05-15 DIAGNOSIS — Y998 Other external cause status: Secondary | ICD-10-CM | POA: Insufficient documentation

## 2015-05-15 DIAGNOSIS — Z87442 Personal history of urinary calculi: Secondary | ICD-10-CM | POA: Insufficient documentation

## 2015-05-15 DIAGNOSIS — S299XXA Unspecified injury of thorax, initial encounter: Secondary | ICD-10-CM | POA: Insufficient documentation

## 2015-05-16 ENCOUNTER — Encounter (HOSPITAL_BASED_OUTPATIENT_CLINIC_OR_DEPARTMENT_OTHER): Payer: Self-pay | Admitting: Emergency Medicine

## 2015-05-16 ENCOUNTER — Emergency Department (HOSPITAL_BASED_OUTPATIENT_CLINIC_OR_DEPARTMENT_OTHER)
Admission: EM | Admit: 2015-05-16 | Discharge: 2015-05-16 | Disposition: A | Discharge status: Home / Self Care | Payer: Self-pay | Attending: Emergency Medicine | Admitting: Emergency Medicine

## 2015-05-16 ENCOUNTER — Emergency Department (HOSPITAL_BASED_OUTPATIENT_CLINIC_OR_DEPARTMENT_OTHER): Payer: Self-pay

## 2015-05-16 DIAGNOSIS — S298XXA Other specified injuries of thorax, initial encounter: Secondary | ICD-10-CM

## 2015-05-16 MED ORDER — HYDROCODONE-ACETAMINOPHEN 5-325 MG PO TABS: 1.0000 | ORAL_TABLET | Freq: Four times a day (QID) | ORAL | Status: DC | PRN

## 2015-05-16 NOTE — ED Notes (Addendum)
Assumed care of patient from [place], [place], pt resting quietly, awaiting EDP disposition.
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
## 2015-05-16 NOTE — Discharge Instructions (Signed)
Blunt Chest Trauma °Blunt chest trauma is an injury caused by a blow to the chest. These chest injuries can be very painful. Blunt chest trauma often results in bruised or broken (fractured) ribs. Most cases of bruised and fractured ribs from blunt chest traumas get better after 1 to 3 weeks of rest and pain medicine. Often, the soft tissue in the chest wall is also injured, causing pain and bruising. Internal organs, such as the heart and lungs, may also be injured. Blunt chest trauma can lead to serious medical problems. This injury requires immediate medical care. °CAUSES  °· Motor vehicle collisions. °· Falls. °· Physical violence. °· Sports injuries. °SYMPTOMS  °· Chest pain. The pain may be worse when you move or breathe deeply. °· Shortness of breath. °· Lightheadedness. °· Bruising. °· Tenderness. °· Swelling. °DIAGNOSIS  °Your caregiver will do a physical exam. X-rays may be taken to look for fractures. However, minor rib fractures may not show up on X-rays until a few days after the injury. If a more serious injury is suspected, further imaging tests may be done. This may include ultrasounds, computed tomography (CT) scans, or magnetic resonance imaging (MRI). °TREATMENT  °Treatment depends on the severity of your injury. Your caregiver may prescribe pain medicines and deep breathing exercises. °HOME CARE INSTRUCTIONS °· Limit your activities until you can move around without much pain. °· Do not do any strenuous work until your injury is healed. °· Put ice on the injured area. °¨ Put ice in a plastic bag. °¨ Place a towel between your skin and the bag. °¨ Leave the ice on for 15-20 minutes, 03-04 times a day. °· You may wear a rib belt as directed by your caregiver to reduce pain. °· Practice deep breathing as directed by your caregiver to keep your lungs clear. °· Only take over-the-counter or prescription medicines for pain, fever, or discomfort as directed by your caregiver. °SEEK IMMEDIATE MEDICAL  CARE IF:  °· You have increasing pain or shortness of breath. °· You cough up blood. °· You have nausea, vomiting, or abdominal pain. °· You have a fever. °· You feel dizzy, weak, or you faint. °MAKE SURE YOU: °· Understand these instructions. °· Will watch your condition. °· Will get help right away if you are not doing well or get worse. °Document Released: 09/21/2004 Document Revised: 11/06/2011 Document Reviewed: 05/31/2011 °ExitCare® Patient Information ©2015 [employer]. This information is not intended to replace advice given to you by your health care provider. Make sure you discuss any questions you have with your health care provider. ° ° °

## 2015-09-06 ENCOUNTER — Emergency Department (HOSPITAL_BASED_OUTPATIENT_CLINIC_OR_DEPARTMENT_OTHER)
Admission: EM | Admit: 2015-09-06 | Discharge: 2015-09-06 | Disposition: A | Discharge status: Home / Self Care | Payer: Self-pay | Attending: Emergency Medicine | Admitting: Emergency Medicine

## 2015-09-06 ENCOUNTER — Emergency Department (HOSPITAL_BASED_OUTPATIENT_CLINIC_OR_DEPARTMENT_OTHER): Payer: Self-pay

## 2015-09-06 ENCOUNTER — Encounter (HOSPITAL_BASED_OUTPATIENT_CLINIC_OR_DEPARTMENT_OTHER): Payer: Self-pay

## 2015-09-06 DIAGNOSIS — Z87442 Personal history of urinary calculi: Secondary | ICD-10-CM | POA: Insufficient documentation

## 2015-09-06 DIAGNOSIS — Z8639 Personal history of other endocrine, nutritional and metabolic disease: Secondary | ICD-10-CM | POA: Insufficient documentation

## 2015-09-06 DIAGNOSIS — W000XXA Fall on same level due to ice and snow, initial encounter: Secondary | ICD-10-CM | POA: Insufficient documentation

## 2015-09-06 DIAGNOSIS — Z87891 Personal history of nicotine dependence: Secondary | ICD-10-CM | POA: Insufficient documentation

## 2015-09-06 DIAGNOSIS — Z87448 Personal history of other diseases of urinary system: Secondary | ICD-10-CM | POA: Insufficient documentation

## 2015-09-06 DIAGNOSIS — S5001XA Contusion of right elbow, initial encounter: Secondary | ICD-10-CM | POA: Insufficient documentation

## 2015-09-06 DIAGNOSIS — Y9289 Other specified places as the place of occurrence of the external cause: Secondary | ICD-10-CM | POA: Insufficient documentation

## 2015-09-06 DIAGNOSIS — Y9389 Activity, other specified: Secondary | ICD-10-CM | POA: Insufficient documentation

## 2015-09-06 DIAGNOSIS — Y998 Other external cause status: Secondary | ICD-10-CM | POA: Insufficient documentation

## 2015-09-06 MED ORDER — TRAMADOL HCL 50 MG PO TABS: 50.0000 mg | ORAL_TABLET | Freq: Four times a day (QID) | ORAL | Status: DC | PRN

## 2015-09-06 NOTE — Discharge Instructions (Signed)
Wear arm sling as applied for the next several days for comfort.  Ice for 20 minutes every 2 hours while awake for the next 2 days.  Tramadol as prescribed as needed for pain.  Follow up with your primary Dr. if not improving in the next week.   Elbow Contusion An elbow contusion is a deep bruise of the elbow. Contusions are the result of an injury that caused bleeding under the skin. The contusion may turn blue, purple, or yellow. Minor injuries will give you a painless contusion, but more severe contusions may stay painful and swollen for a few weeks.  CAUSES  An elbow contusion comes from a direct force to that area, such as falling on the elbow. SYMPTOMS   Swelling and redness of the elbow.  Bruising of the elbow area.  Tenderness or soreness of the elbow. DIAGNOSIS  You will have a physical exam and will be asked about your history. You may need an X-ray of your elbow to look for a broken bone (fracture).  TREATMENT  A sling or splint may be needed to support your injury. Resting, elevating, and applying cold compresses to the elbow area are often the best treatments for an elbow contusion. Over-the-counter medicines may also be recommended for pain control. HOME CARE INSTRUCTIONS   Put ice on the injured area.  Put ice in a plastic bag.  Place a towel between your skin and the bag.  Leave the ice on for 15-20 minutes, 03-04 times a day.  Only take over-the-counter or prescription medicines for pain, discomfort, or fever as directed by your caregiver.  Rest your injured elbow until the pain and swelling are better.  Elevate your elbow to reduce swelling.  Apply a compression wrap as directed by your caregiver. This can help reduce swelling and motion. You may remove the wrap for sleeping, showers, and baths. If your fingers become numb, cold, or blue, take the wrap off and reapply it more loosely.  Use your elbow only as directed by your caregiver. You may be asked to  do range of motion exercises. Do them as directed.  See your caregiver as directed. It is very important to keep all follow-up appointments in order to avoid any long-term problems with your elbow, including chronic pain or inability to move your elbow normally. SEEK IMMEDIATE MEDICAL CARE IF:   You have increased redness, swelling, or pain in your elbow.  Your swelling or pain is not relieved with medicines.  You have swelling of the hand and fingers.  You are unable to move your fingers or wrist.  You begin to lose feeling in your hand or fingers.  Your fingers or hand become cold or blue. MAKE SURE YOU:   Understand these instructions.  Will watch your condition.  Will get help right away if you are not doing well or get worse.   This information is not intended to replace advice given to you by your health care provider. Make sure you discuss any questions you have with your health care provider.   Document Released: 07/23/2006 Document Revised: 11/06/2011 Document Reviewed: 03/29/2015 Elsevier Interactive Patient Education [employer].

## 2015-09-06 NOTE — ED Provider Notes (Signed)
CSN: [id number]     Arrival date & time 09/06/15  1516 History  By signing my name below, I, Andrew George, attest that this documentation has been prepared under the direction and in the presence of Andrew Lyons, MD. Electronically Signed: Budd George, ED Scribe. 09/06/2015. 3:37 PM.    Chief Complaint  Patient presents with  . Elbow Injury   Patient is a 25 y.o. male presenting with arm injury. The history is provided by the patient. No language interpreter was used.  Arm Injury Location:  Elbow Time since incident:  1 day Injury: yes   Mechanism of injury: fall   Fall:    Point of impact:  Back Elbow location:  R elbow Pain details:    Quality:  Aching   Radiates to:  Does not radiate   Severity:  Moderate   Onset quality:  Sudden   Duration:  1 day   Timing:  Constant   Progression:  Unchanged Chronicity:  New Dislocation: no   Foreign body present:  No foreign bodies Prior injury to area:  No Associated symptoms: decreased range of motion    HPI Comments: Andrew George is a 25 y.o. male former smoker with a PMHx of HLD and renal disorder who presents to the Emergency Department complaining of an injury to the right elbow sustained last night. Pt states he was getting out of his car when he slipped on the ice and fell backwards, landing on his right elbow and back. He reports associated constant, aching pain and mild swelling to the joint. He notes exacerbation of the pain when trying to grip or squeeze something with his hand, as well as with full extension of the joint   Past Medical History  Diagnosis Date  . Renal disorder   . Hyperlipemia   . Kidney stone   . Back pain    Past Surgical History  Procedure Laterality Date  . Tonsillectomy    . Shoulder surgery    . Adenoidectomy     No family history on file. Social History  Substance Use Topics  . Smoking status: Former Smoker -- 0.50 packs/day    Types: Cigarettes    Quit date: 02/10/2015  . Smokeless  tobacco: None  . Alcohol Use: No    Review of Systems  Musculoskeletal: Positive for joint swelling and arthralgias.  All other systems reviewed and are negative.   Allergies  Ketorolac tromethamine  Home Medications   Prior to Admission medications   Not on File   BP 117/90 mmHg  Pulse 86  Temp(Src) 98.1 F (36.7 C) (Oral)  Resp 18  Ht 6' (1.829 m)  Wt 300 lb (136.079 kg)  BMI 40.68 kg/m2  SpO2 99% Physical Exam  Constitutional: He is oriented to person, place, and time. He appears well-developed and well-nourished.  HENT:  Head: Normocephalic and atraumatic.  Eyes: Conjunctivae are normal. Right eye exhibits no discharge. Left eye exhibits no discharge.  Pulmonary/Chest: Effort normal. No respiratory distress.  Musculoskeletal: He exhibits edema and tenderness.  R elbow is noted to have slight swelling and ecchymosis over the olecranon. Pain with full extension, otherwise good ROM. Sensation intact to the entire hand. Able to flex, extend, and oppose all fingers  Neurological: He is alert and oriented to person, place, and time. Coordination normal.  Skin: Skin is warm and dry. No rash noted. He is not diaphoretic. No erythema.  Psychiatric: He has a normal mood and affect.  Nursing note and vitals  reviewed.   ED Course  Procedures  DIAGNOSTIC STUDIES: Oxygen Saturation is 99% on RA, normal by my interpretation.    COORDINATION OF CARE: 3:32 PM - Discussed plans to wait on diagnostic imaging. Pt advised of plan for treatment and pt agrees.  Labs Review Labs Reviewed - No data to display  Imaging Review No results found. I have personally reviewed and evaluated these images and lab results as part of my medical decision-making.   EKG Interpretation None      MDM   Final diagnoses:  None    X-rays are negative for fracture. This appears to be a contusion. There is no evidence for compartment syndrome and the rest of the arm is neurovascularly intact.  We'll treat with an arm sling, ice, tramadol, and when necessary return.  I personally performed the services described in this documentation, which was scribed in my presence. The recorded information has been reviewed and is accurate.       Andrew Lyonsouglas Alessandro Griep, MD 09/06/15 1550

## 2015-09-06 NOTE — ED Notes (Signed)
Fall last night-pain to right elbow

## 2015-11-10 ENCOUNTER — Encounter (HOSPITAL_BASED_OUTPATIENT_CLINIC_OR_DEPARTMENT_OTHER): Payer: Self-pay | Admitting: *Deleted

## 2015-11-10 ENCOUNTER — Emergency Department (HOSPITAL_BASED_OUTPATIENT_CLINIC_OR_DEPARTMENT_OTHER)
Admission: EM | Admit: 2015-11-10 | Discharge: 2015-11-10 | Disposition: A | Discharge status: Home / Self Care | Payer: Self-pay | Attending: Emergency Medicine | Admitting: Emergency Medicine

## 2015-11-10 DIAGNOSIS — A084 Viral intestinal infection, unspecified: Secondary | ICD-10-CM | POA: Insufficient documentation

## 2015-11-10 DIAGNOSIS — Z87891 Personal history of nicotine dependence: Secondary | ICD-10-CM | POA: Insufficient documentation

## 2015-11-10 DIAGNOSIS — Z8639 Personal history of other endocrine, nutritional and metabolic disease: Secondary | ICD-10-CM | POA: Insufficient documentation

## 2015-11-10 DIAGNOSIS — Z87448 Personal history of other diseases of urinary system: Secondary | ICD-10-CM | POA: Insufficient documentation

## 2015-11-10 DIAGNOSIS — Z87442 Personal history of urinary calculi: Secondary | ICD-10-CM | POA: Insufficient documentation

## 2015-11-10 HISTORY — DX: Calculus of ureter: N20.1

## 2015-11-10 MED ORDER — SODIUM CHLORIDE 0.9 % IV BOLUS (SEPSIS)
1000.0000 mL | Freq: Once | INTRAVENOUS | Status: AC
  Administered 2015-11-10: 1000 mL via INTRAVENOUS

## 2015-11-10 MED ORDER — ONDANSETRON HCL 4 MG/2ML IJ SOLN
4.0000 mg | Freq: Once | INTRAMUSCULAR | Status: AC
  Administered 2015-11-10: 4 mg via INTRAVENOUS
  Filled 2015-11-10: qty 2

## 2015-11-10 MED ORDER — ONDANSETRON 8 MG PO TBDP: 8.0000 mg | ORAL_TABLET | Freq: Three times a day (TID) | ORAL | Status: DC | PRN

## 2015-11-10 NOTE — ED Notes (Signed)
Pt. Reports he has had vomiting since yesterday meaning Monday at 9am.  Pt. Reports vomiting 3xs today.  Pt. Reports dry heaving and nausea all day.  Pt. Reports missing work [employer].

## 2015-11-10 NOTE — Discharge Instructions (Signed)

## 2015-11-10 NOTE — ED Provider Notes (Signed)
CSN: [id number]     Arrival date & time 11/10/15  0058 History   First MD Initiated Contact with Patient 11/10/15 0115     Chief Complaint  Patient presents with  . Vomiting      (Consider location/radiation/quality/duration/timing/severity/associated sxs/prior Treatment) HPI  This is a 25 year old male with a two-day history of nausea, vomiting and diarrhea. Symptoms and been moderate to severe. Vomiting is exacerbated by attempts to eat or drink. Diarrhea has been intermittent. He is not having abdominal pain with this but does have some chest wall pain from vomiting; this is worse with taking a deep breath or coughing.  Past Medical History  Diagnosis Date  . Renal disorder   . Hyperlipemia   . Kidney stone   . Back pain    Past Surgical History  Procedure Laterality Date  . Tonsillectomy    . Shoulder surgery    . Adenoidectomy     No family history on file. Social History  Substance Use Topics  . Smoking status: Former Smoker -- 0.50 packs/day    Types: Cigarettes    Quit date: 02/10/2015  . Smokeless tobacco: None  . Alcohol Use: No    Review of Systems  All other systems reviewed and are negative.   Allergies  Ketorolac tromethamine  Home Medications   Prior to Admission medications   Medication Sig Start Date End Date Taking? Authorizing Provider  traMADol (ULTRAM) 50 MG tablet Take 1 tablet (50 mg total) by mouth every 6 (six) hours as needed. 09/06/15   Geoffery Lyonsouglas Delo, MD   BP 141/92 mmHg  Pulse 89  Temp(Src) 98.5 F (36.9 C) (Oral)  Ht 6' (1.829 m)  Wt 300 lb (136.079 kg)  BMI 40.68 kg/m2  SpO2 98%   Physical Exam  General: Well-developed, well-nourished male in no acute distress; appearance consistent with age of record HENT: normocephalic; atraumatic Eyes: pupils equal, round and reactive to light; extraocular muscles intact Neck: supple Heart: regular rate and rhythm Lungs: clear to auscultation bilaterally Chest: Anterior chest wall  tenderness Abdomen: soft; nondistended; minimal epigastric tenderness; no masses or hepatosplenomegaly; bowel sounds present Extremities: No deformity; full range of motion; pulses normal Neurologic: Awake, alert and oriented; motor function intact in all extremities and symmetric; no facial droop Skin: Warm and dry Psychiatric: Flat affect    ED Course  Procedures (including critical care time)   MDM  3:10 AM Feels better, drinking fluids without emesis after IV fluid bolus and Zofran.   Paula LibraJohn Jeanluc Wegman, MD 11/10/15 [phone]

## 2015-12-11 ENCOUNTER — Emergency Department (HOSPITAL_BASED_OUTPATIENT_CLINIC_OR_DEPARTMENT_OTHER): Payer: Self-pay

## 2015-12-11 ENCOUNTER — Emergency Department (HOSPITAL_BASED_OUTPATIENT_CLINIC_OR_DEPARTMENT_OTHER)
Admission: EM | Admit: 2015-12-11 | Discharge: 2015-12-11 | Disposition: A | Discharge status: Home / Self Care | Payer: Self-pay | Attending: Emergency Medicine | Admitting: Emergency Medicine

## 2015-12-11 ENCOUNTER — Encounter (HOSPITAL_BASED_OUTPATIENT_CLINIC_OR_DEPARTMENT_OTHER): Payer: Self-pay

## 2015-12-11 DIAGNOSIS — Z8639 Personal history of other endocrine, nutritional and metabolic disease: Secondary | ICD-10-CM | POA: Insufficient documentation

## 2015-12-11 DIAGNOSIS — Z87448 Personal history of other diseases of urinary system: Secondary | ICD-10-CM | POA: Insufficient documentation

## 2015-12-11 DIAGNOSIS — M545 Low back pain: Secondary | ICD-10-CM | POA: Insufficient documentation

## 2015-12-11 DIAGNOSIS — M5489 Other dorsalgia: Secondary | ICD-10-CM

## 2015-12-11 DIAGNOSIS — R200 Anesthesia of skin: Secondary | ICD-10-CM | POA: Insufficient documentation

## 2015-12-11 DIAGNOSIS — M546 Pain in thoracic spine: Secondary | ICD-10-CM | POA: Insufficient documentation

## 2015-12-11 DIAGNOSIS — Z87891 Personal history of nicotine dependence: Secondary | ICD-10-CM | POA: Insufficient documentation

## 2015-12-11 MED ORDER — IBUPROFEN 800 MG PO TABS: 800.0000 mg | ORAL_TABLET | Freq: Three times a day (TID) | ORAL | Status: DC

## 2015-12-11 MED ORDER — DIAZEPAM 5 MG PO TABS
5.0000 mg | ORAL_TABLET | Freq: Once | ORAL | Status: AC
  Administered 2015-12-11: 5 mg via ORAL
  Filled 2015-12-11: qty 1

## 2015-12-11 MED ORDER — TRAMADOL HCL 50 MG PO TABS
100.0000 mg | ORAL_TABLET | Freq: Once | ORAL | Status: AC
  Administered 2015-12-11: 100 mg via ORAL
  Filled 2015-12-11: qty 2

## 2015-12-11 MED ORDER — CYCLOBENZAPRINE HCL 10 MG PO TABS: 10.0000 mg | ORAL_TABLET | Freq: Two times a day (BID) | ORAL | Status: DC | PRN

## 2015-12-11 MED ORDER — TRAMADOL HCL 50 MG PO TABS: 50.0000 mg | ORAL_TABLET | Freq: Four times a day (QID) | ORAL | Status: DC | PRN

## 2015-12-11 MED ORDER — METHOCARBAMOL 1000 MG/10ML IJ SOLN: 1000.0000 mg | Freq: Once | INTRAMUSCULAR | Status: DC

## 2015-12-11 NOTE — Discharge Instructions (Signed)
Take your medications as prescribed. I recommend eating prior to taking ibuprofen to prevent gastrointestinal side effects. He may also apply ice or heat to affected area for 15-20 minutes 3-4 times daily to help with pain. Refrain from doing any heavy lifting, squatting or repetitive movements that exacerbate her symptoms for the next few days. Please follow up with a primary care provider from the Resource Guide provided below in 5 days as needed if your symptoms have not improved. Please return to the Emergency Department if symptoms worsen or new onset of  fever, numbness, tingling, groin numbness, abdominal pain, loss of bowel or bladder, weakness.

## 2015-12-11 NOTE — ED Provider Notes (Signed)
CSN: [id number]     Arrival date & time 12/11/15  1000 History   First MD Initiated Contact with Patient 12/11/15 1038     Chief Complaint  Patient presents with  . Back Pain     (Consider location/radiation/quality/duration/timing/severity/associated sxs/prior Treatment) HPI  Patient is a 25 year old male with past medical history of hyperlipidemia and back pain who presents the ED with complaints of back pain, onset 3 AM. Patient reports he woke up this morning and began having constant sharp stabbing pain to his thoracic and lumbar spine. He notes his pain radiates down his right leg. He states pain is worse with movement or bending and is alleviated when laying on his side. He endorses associated intermittent tingling to his right foot which is aggravated with movement and bending. He states he has had similar pain in the past and notes he was evaluated at a clinic last month, given prescription for Flexeril and tramadol with relief of symptoms. Patient denies any recent fall, trauma, injury. He reports he works at [employer] facility and sits during most of his shift. He endorses doing yard work with his mother yesterday and endorses squatting and doing more physical activity than his usual routine. Pt denies fever, numbness, saddle anesthesia, loss of bowel or bladder, urinary retention, abdominal pain, N/V, weakness, IVDU, cancer or recent spinal manipulation. Patient denies taking any medications prior to arrival.  Past Medical History  Diagnosis Date  . Hyperlipemia   . Back pain   . Ureterolithiasis    Past Surgical History  Procedure Laterality Date  . Tonsillectomy    . Shoulder surgery    . Adenoidectomy     No family history on file. Social History  Substance Use Topics  . Smoking status: Former Smoker -- 0.50 packs/day    Types: Cigarettes    Quit date: 02/10/2015  . Smokeless tobacco: None  . Alcohol Use: No    Review of Systems  Musculoskeletal: Positive for back  pain.  Neurological: Positive for numbness.  All other systems reviewed and are negative.     Allergies  Ketorolac tromethamine  Home Medications   Prior to Admission medications   Medication Sig Start Date End Date Taking? Authorizing Provider  cyclobenzaprine (FLEXERIL) 10 MG tablet Take 1 tablet (10 mg total) by mouth 2 (two) times daily as needed for muscle spasms. 12/11/15   Barrett HenleNicole Elizabeth Lina Hitch, PA-C  ibuprofen (ADVIL,MOTRIN) 800 MG tablet Take 1 tablet (800 mg total) by mouth 3 (three) times daily. 12/11/15   Barrett HenleNicole Elizabeth Kasten Leveque, PA-C  traMADol (ULTRAM) 50 MG tablet Take 1 tablet (50 mg total) by mouth every 6 (six) hours as needed. 12/11/15   Satira SarkNicole Elizabeth Romayne Ticas, PA-C   BP 120/82 mmHg  Pulse 84  Temp(Src) 99 F (37.2 C) (Oral)  Resp 20  Ht 6\' 1"  (1.854 m)  Wt 136.079 kg  BMI 39.59 kg/m2  SpO2 98% Physical Exam  Constitutional: He is oriented to person, place, and time. He appears well-developed and well-nourished.  HENT:  Head: Normocephalic and atraumatic.  Eyes: Conjunctivae and EOM are normal. Right eye exhibits no discharge. Left eye exhibits no discharge. No scleral icterus.  Neck: Normal range of motion. Neck supple.  Cardiovascular: Normal rate, regular rhythm, normal heart sounds and intact distal pulses.   Pulmonary/Chest: Effort normal and breath sounds normal. No respiratory distress. He has no wheezes. He has no rales. He exhibits no tenderness.  Abdominal: Soft. Bowel sounds are normal. He exhibits no distension and  no mass. There is no tenderness. There is no rebound and no guarding.  Musculoskeletal: He exhibits tenderness. He exhibits no edema.  Midline T and L tenderness, no midline cervical tenderness. Mild TTP over right mid-thoracic through lumbar paraspinal muscles. Full range of motion of neck, dec ROM of back due to reported pain.   Neurological: He is alert and oriented to person, place, and time. No sensory deficit.  Skin: Skin is warm  and dry.  Nursing note and vitals reviewed.   ED Course  Procedures (including critical care time) Labs Review Labs Reviewed - No data to display  Imaging Review Dg Thoracic Spine 2 View  12/11/2015  CLINICAL DATA:  Thoracic and lumbar spine today.  No known injury. EXAM: THORACIC SPINE 2 VIEWS COMPARISON:  08/13/2014 and chest radiographs dated 02/27/2015. FINDINGS: Stable mild levoconvex thoracic scoliosis. Mild anterior wedge deformities in the lower thoracic spine are unchanged. No acute fracture or subluxation. IMPRESSION: No acute fracture or subluxation. Chronic mild anterior wedge deformities in the lower thoracic spine. Electronically Signed   By: Beckie Salts M.D.   On: 12/11/2015 12:07   Dg Lumbar Spine Complete  12/11/2015  CLINICAL DATA:  Thoracic and lumbar spine pain.  No known injury. EXAM: LUMBAR SPINE - COMPLETE 4+ VIEW COMPARISON:  Abdomen and pelvis CT dated 12/07/2014. FINDINGS: Transitional thoracolumbar and lumbosacral vertebrae with 4 intervening lumbar vertebrae. These continue to have normal appearances. No lumbar spine fractures, pars defects or subluxations are seen. Previously noted lower thoracic vertebral wedge deformities and mild secondary anterior spur formation. IMPRESSION: 1. Normal appearing lumbar spine. 2. Chronic mild lower thoracic vertebral compression deformities and mild secondary degenerative changes. Electronically Signed   By: Beckie Salts M.D.   On: 12/11/2015 12:09   I have personally reviewed and evaluated these images and lab results as part of my medical decision-making.   EKG Interpretation None      MDM   Final diagnoses:  Midline back pain, unspecified location    Patient with back pain. Denies any recent fall, trauma, injury. Initial exam limited due to reported pain, midline thoracic and lumbar tenderness present. Patient given Valium and Ultram in the ED. Thoracic and lumbar spine x-ray revealed chronic mild anterior wedge  deformities and lower thoracic spine, no acute fracture or subluxation. On reevaluation patient reports his pain has improved. On exam, decreased range of motion of back due to pain, positive right straight leg raise. Full range of motion of bilateral upper and lower extremities, with 5/5 strength. Sensation intact. 2+ radial and PT pulses. Cap refill <2 seconds. Patient able to stand and ambulate without assistance. DTRs normal. No neurological deficits and normal neuro exam.  No loss of bowel or bladder control.  No concern for cauda equina.  No fever, night sweats, weight loss, h/o cancer, IVDU.  RICE protocol and pain medicine indicated and discussed with patient. Plan to d/c home.  Evaluation does not show pathology requring ongoing emergent intervention or admission. Pt is hemodynamically stable and mentating appropriately. Discussed findings/results and plan with patient/guardian, who agrees with plan. All questions answered. Return precautions discussed and outpatient follow up given.    Barrett Henle, PA-C 12/11/15 1234  Loren Racer, MD 12/14/15 [phone]

## 2015-12-11 NOTE — ED Notes (Addendum)
Patient reports that he awoke with severe thoracic and lumbar back pain this am. When he went to bed last night no pain. Ambulatory on arrival. Reports tingling in right leg

## 2016-05-21 ENCOUNTER — Emergency Department (HOSPITAL_BASED_OUTPATIENT_CLINIC_OR_DEPARTMENT_OTHER): Payer: Self-pay

## 2016-05-21 ENCOUNTER — Encounter (HOSPITAL_BASED_OUTPATIENT_CLINIC_OR_DEPARTMENT_OTHER): Payer: Self-pay | Admitting: Emergency Medicine

## 2016-05-21 ENCOUNTER — Emergency Department (HOSPITAL_BASED_OUTPATIENT_CLINIC_OR_DEPARTMENT_OTHER)
Admission: EM | Admit: 2016-05-21 | Discharge: 2016-05-21 | Disposition: A | Discharge status: Home / Self Care | Payer: Self-pay | Attending: Emergency Medicine | Admitting: Emergency Medicine

## 2016-05-21 DIAGNOSIS — R079 Chest pain, unspecified: Secondary | ICD-10-CM | POA: Insufficient documentation

## 2016-05-21 DIAGNOSIS — J069 Acute upper respiratory infection, unspecified: Secondary | ICD-10-CM | POA: Insufficient documentation

## 2016-05-21 DIAGNOSIS — F17210 Nicotine dependence, cigarettes, uncomplicated: Secondary | ICD-10-CM | POA: Insufficient documentation

## 2016-05-21 LAB — RAPID STREP SCREEN (MED CTR MEBANE ONLY): STREPTOCOCCUS, GROUP A SCREEN (DIRECT): NEGATIVE

## 2016-05-21 MED ORDER — HYDROCODONE-ACETAMINOPHEN 5-325 MG PO TABS: 1.0000 | ORAL_TABLET | ORAL | 0 refills | Status: DC | PRN

## 2016-05-21 MED ORDER — ONDANSETRON HCL 4 MG/2ML IJ SOLN
4.0000 mg | Freq: Once | INTRAMUSCULAR | Status: AC
  Administered 2016-05-21: 4 mg via INTRAVENOUS
  Filled 2016-05-21: qty 2

## 2016-05-21 MED ORDER — SODIUM CHLORIDE 0.9 % IV BOLUS (SEPSIS)
1000.0000 mL | Freq: Once | INTRAVENOUS | Status: AC
  Administered 2016-05-21: 1000 mL via INTRAVENOUS

## 2016-05-21 MED ORDER — IBUPROFEN 800 MG PO TABS
800.0000 mg | ORAL_TABLET | Freq: Once | ORAL | Status: AC
  Administered 2016-05-21: 800 mg via ORAL
  Filled 2016-05-21: qty 1

## 2016-05-21 NOTE — ED Triage Notes (Signed)
Patient c/o cough that started Friday, now having chest discomfort from coughing. Patient states he is coughing up grey-green mucous. Patient with chills and sore throat. Patient states he can't sleep due to the cough.

## 2016-05-21 NOTE — ED Provider Notes (Signed)
[facility] Note   CSN: [id number] Arrival date & time: 05/21/16  1633  By signing my name below, I, Phillis Haggis, attest that this documentation has been prepared under the direction and in the presence of Benjiman Core, MD. Electronically Signed: Phillis Haggis, ED Scribe. 05/21/16. 5:31 PM.  History   Chief Complaint Chief Complaint  Patient presents with  . Cough    headache, chills   The history is provided by the patient. No language interpreter was used.  HPI Comments: Andrew George is a 25 y.o. male who presents to the Emergency Department complaining of gradually worsening productive cough with green sputum onset 4 days ago. Pt reports associated burning chest pain, chills, sore throat, nausea, and headache. Pt reports worsening chest pain with deep breathing. He has been around sick contacts at work. He denies fever, diarrhea, or vomiting. Pt is allergic to Toradol.   Past Medical History:  Diagnosis Date  . Back pain   . Hyperlipemia   . Ureterolithiasis    There are no active problems to display for this patient.  Past Surgical History:  Procedure Laterality Date  . ADENOIDECTOMY    . SHOULDER SURGERY    . TONSILLECTOMY      Home Medications    Prior to Admission medications   Medication Sig Start Date End Date Taking? Authorizing Provider  cyclobenzaprine (FLEXERIL) 10 MG tablet Take 1 tablet (10 mg total) by mouth 2 (two) times daily as needed for muscle spasms. 12/11/15   Barrett Henle, PA-C  HYDROcodone-acetaminophen (NORCO/VICODIN) 5-325 MG tablet Take 1-2 tablets by mouth every 4 (four) hours as needed. 05/21/16   Benjiman Core, MD  ibuprofen (ADVIL,MOTRIN) 800 MG tablet Take 1 tablet (800 mg total) by mouth 3 (three) times daily. 12/11/15   Barrett Henle, PA-C  traMADol (ULTRAM) 50 MG tablet Take 1 tablet (50 mg total) by mouth every 6 (six) hours as needed. 12/11/15   Barrett Henle, PA-C    Family  History History reviewed. No pertinent family history.  Social History Social History  Substance Use Topics  . Smoking status: Current Every Day Smoker    Packs/day: 0.50    Types: Cigarettes    Last attempt to quit: 02/10/2015  . Smokeless tobacco: Current User    Types: Chew  . Alcohol use No     Allergies   Ketorolac tromethamine   Review of Systems Review of Systems  Constitutional: Positive for chills.  HENT: Positive for sore throat.   Respiratory: Positive for cough.   Cardiovascular: Positive for chest pain.  Gastrointestinal: Positive for nausea. Negative for diarrhea and vomiting.  Neurological: Positive for headaches.  All other systems reviewed and are negative.  Physical Exam Updated Vital Signs BP (!) 143/102 (BP Location: Left Arm)   Pulse 88   Temp 98.1 F (36.7 C) (Oral)   Resp 20   Ht 6\' 1"  (1.854 m)   Wt 300 lb (136.1 kg)   SpO2 98%   BMI 39.58 kg/m   Physical Exam  Constitutional: He is oriented to person, place, and time. He appears well-developed and well-nourished.  HENT:  Head: Normocephalic and atraumatic.  Mouth/Throat: Posterior oropharyngeal erythema present. No oropharyngeal exudate.  Eyes: EOM are normal. Pupils are equal, round, and reactive to light.  Neck: Normal range of motion. Neck supple.  Mild cervical adenopathy  Cardiovascular: Normal rate, regular rhythm and normal heart sounds.  Exam reveals no gallop and no friction rub.  No murmur heard. Pulmonary/Chest: Effort normal and breath sounds normal. Tachypnea noted. He has no wheezes.  Mild anterior chest wall tenderness  Abdominal: Soft. There is no tenderness.  Musculoskeletal: Normal range of motion. He exhibits no edema.  Lymphadenopathy:    He has cervical adenopathy.  Neurological: He is alert and oriented to person, place, and time.  Skin: Skin is warm and dry.  Psychiatric: He has a normal mood and affect. His behavior is normal.  Nursing note and vitals  reviewed.  ED Treatments / Results  DIAGNOSTIC STUDIES: Oxygen Saturation is 98% on RA, normal by my interpretation.    COORDINATION OF CARE: 5:28 PM-Discussed treatment plan which includes labs, chest x-ray and IV fluids with pt at bedside and pt agreed to plan.    Labs (all labs ordered are listed, but only abnormal results are displayed) Labs Reviewed  RAPID STREP SCREEN (NOT AT [facility])  CULTURE, GROUP A STREP [facility])    EKG  EKG Interpretation None       Radiology Dg Chest 2 View  Result Date: 05/21/2016 CLINICAL DATA:  Cough and congestion for 3 days EXAM: CHEST  2 VIEW COMPARISON:  February 27, 2015 FINDINGS: Lungs are clear. Heart size and pulmonary vascularity are normal. No adenopathy. No bone lesions. IMPRESSION: No edema or consolidation. Electronically Signed   By: Bretta BangWilliam  Woodruff III M.D.   On: 05/21/2016 18:07    Procedures Procedures (including critical care time)  Medications Ordered in ED Medications  sodium chloride 0.9 % bolus 1,000 mL (0 mLs Intravenous Stopped 05/21/16 1842)  ibuprofen (ADVIL,MOTRIN) tablet 800 mg (800 mg Oral Given 05/21/16 1747)  ondansetron (ZOFRAN) injection 4 mg (4 mg Intravenous Given 05/21/16 1748)     Initial Impression / Assessment and Plan / ED Course  I have reviewed the triage vital signs and the nursing notes.  Pertinent labs & imaging results that were available during my care of the patient were reviewed by me and considered in my medical decision making (see chart for details).  Clinical Course    Patient with flulike symptoms. Cough with negative x-ray. Some chest pain. Not hypoxic. Nonfocal exam. Will discharge home.  Final Clinical Impressions(s) / ED Diagnoses   Final diagnoses:  URI (upper respiratory infection)  I personally performed the services described in this documentation, which was scribed in my presence. The recorded information has been reviewed and is accurate.     New Prescriptions New  Prescriptions   HYDROCODONE-ACETAMINOPHEN (NORCO/VICODIN) 5-325 MG TABLET    Take 1-2 tablets by mouth every 4 (four) hours as needed.     Benjiman CoreNathan Nala Kachel, MD 05/21/16 [phone]

## 2016-05-21 NOTE — ED Notes (Signed)
Pt c/o pain across upper chest; worse w/ cough.

## 2016-05-24 LAB — CULTURE, GROUP A STREP (THRC)

## 2016-11-21 ENCOUNTER — Emergency Department (HOSPITAL_BASED_OUTPATIENT_CLINIC_OR_DEPARTMENT_OTHER)
Admission: EM | Admit: 2016-11-21 | Discharge: 2016-11-21 | Disposition: A | Discharge status: Home / Self Care | Payer: Self-pay | Attending: Emergency Medicine | Admitting: Emergency Medicine

## 2016-11-21 ENCOUNTER — Encounter (HOSPITAL_BASED_OUTPATIENT_CLINIC_OR_DEPARTMENT_OTHER): Payer: Self-pay | Admitting: *Deleted

## 2016-11-21 DIAGNOSIS — K029 Dental caries, unspecified: Secondary | ICD-10-CM | POA: Insufficient documentation

## 2016-11-21 DIAGNOSIS — F17220 Nicotine dependence, chewing tobacco, uncomplicated: Secondary | ICD-10-CM | POA: Insufficient documentation

## 2016-11-21 DIAGNOSIS — F17210 Nicotine dependence, cigarettes, uncomplicated: Secondary | ICD-10-CM | POA: Insufficient documentation

## 2016-11-21 MED ORDER — IBUPROFEN 600 MG PO TABS: 600.0000 mg | ORAL_TABLET | Freq: Four times a day (QID) | ORAL | 0 refills | Status: DC | PRN

## 2016-11-21 MED ORDER — HYDROCODONE-ACETAMINOPHEN 5-325 MG PO TABS: 1.0000 | ORAL_TABLET | Freq: Four times a day (QID) | ORAL | 0 refills | Status: DC | PRN

## 2016-11-21 MED FILL — IBUPROFEN 600 MG TABLET: 600 | 8 days supply | Qty: 30 | Fill #0

## 2016-11-21 MED FILL — HYDROCODON-APAP 5-325: 5-325 | 2 days supply | Qty: 6 | Fill #0

## 2016-11-21 NOTE — Discharge Instructions (Signed)
Take Ibuprofen 3-4 times daily. Take with food Follow up with Dr. Mayford Knifeurner within 2 days

## 2016-11-21 NOTE — ED Triage Notes (Signed)
States one of his teeth fell out this am. Dental caries noted.

## 2016-11-21 NOTE — ED Notes (Signed)
Pt directed to pharmacy to pick up Rx 

## 2016-11-21 NOTE — ED Provider Notes (Signed)
[facility]   CSN: [id number] Arrival date & time: 11/21/16  1253     History   Chief Complaint Chief Complaint  Patient presents with  . Dental Pain    HPI Andrew George is a 26 y.o. male who presents with dental pain. PMH significant for dental caries. He had an acute onset of pain today at 10AM after biting down on a sandwich and part of his tooth came off. The pain is in the front right tooth and radiates to his right eye. He does have intermittent dental pain due to dental caries and has been seen in the ED for this before. Does not have a dentist. No fever, facial swelling, or inability to swallow  HPI  Past Medical History:  Diagnosis Date  . Back pain   . Hyperlipemia   . Ureterolithiasis     There are no active problems to display for this patient.   Past Surgical History:  Procedure Laterality Date  . ADENOIDECTOMY    . SHOULDER SURGERY    . TONSILLECTOMY         Home Medications    Prior to Admission medications   Medication Sig Start Date End Date Taking? Authorizing Provider  cyclobenzaprine (FLEXERIL) 10 MG tablet Take 1 tablet (10 mg total) by mouth 2 (two) times daily as needed for muscle spasms. 12/11/15   Barrett HenleNicole Elizabeth Nadeau, PA-C  HYDROcodone-acetaminophen (NORCO/VICODIN) 5-325 MG tablet Take 1-2 tablets by mouth every 4 (four) hours as needed. 05/21/16   Benjiman CoreNathan Pickering, MD  ibuprofen (ADVIL,MOTRIN) 800 MG tablet Take 1 tablet (800 mg total) by mouth 3 (three) times daily. 12/11/15   Barrett HenleNicole Elizabeth Nadeau, PA-C  traMADol (ULTRAM) 50 MG tablet Take 1 tablet (50 mg total) by mouth every 6 (six) hours as needed. 12/11/15   Barrett HenleNicole Elizabeth Nadeau, PA-C    Family History No family history on file.  Social History Social History  Substance Use Topics  . Smoking status: Current Every Day Smoker    Packs/day: 0.50    Types: Cigarettes    Last attempt to quit: 02/10/2015  . Smokeless tobacco: Current User    Types:  Chew  . Alcohol use No     Allergies   Ketorolac tromethamine   Review of Systems Review of Systems  Constitutional: Negative for fever.  HENT: Positive for dental problem. Negative for facial swelling and trouble swallowing.      Physical Exam Updated Vital Signs BP 124/76   Pulse 80   Temp 98 F (36.7 C) (Oral)   Resp 14   Ht 6' (1.829 m)   Wt 133.8 kg   SpO2 99%   BMI 40.01 kg/m   Physical Exam  Constitutional: He appears well-developed and well-nourished. He appears distressed (tearful).  HENT:  Head: Normocephalic and atraumatic.  Mouth/Throat: Uvula is midline. No trismus in the jaw. Dental caries present. No dental abscesses.  Poor dentition with multiple dental caries. No trismus. No drainable abscess. Front right incisor has piece missing  Eyes: Conjunctivae are normal.  Neck: Neck supple.  Cardiovascular: Normal rate and regular rhythm.   No murmur heard. Pulmonary/Chest: Effort normal and breath sounds normal. No respiratory distress.  Abdominal: Soft. There is no tenderness.  Musculoskeletal: He exhibits no edema.  Neurological: He is alert.  Skin: Skin is warm and dry.  Psychiatric: He has a normal mood and affect.  Nursing note and vitals reviewed.    ED Treatments / Results  Labs (all  labs ordered are listed, but only abnormal results are displayed) Labs Reviewed - No data to display  EKG  EKG Interpretation None       Radiology No results found.  Procedures Procedures (including critical care time)  Medications Ordered in ED Medications - No data to display   Initial Impression / Assessment and Plan / ED Course  I have reviewed the triage vital signs and the nursing notes.  Pertinent labs & imaging results that were available during my care of the patient were reviewed by me and considered in my medical decision making (see chart for details).  26 year old male with pain due to dental fracture. Will give pain control. Vitals  are normal. No facial swelling or trismus. Will refer to Dr. Mayford Knife who is dentist on call today. Return precautions given.  Final Clinical Impressions(s) / ED Diagnoses   Final diagnoses:  Pain due to dental caries    New Prescriptions New Prescriptions   HYDROCODONE-ACETAMINOPHEN (NORCO/VICODIN) 5-325 MG TABLET    Take 1 tablet by mouth every 6 (six) hours as needed for severe pain.   IBUPROFEN (ADVIL,MOTRIN) 600 MG TABLET    Take 1 tablet (600 mg total) by mouth every 6 (six) hours as needed.     Bethel Born, PA-C 11/21/16 1427    Loren Racer, MD 11/23/16 [phone]

## 2016-11-27 ENCOUNTER — Emergency Department (HOSPITAL_BASED_OUTPATIENT_CLINIC_OR_DEPARTMENT_OTHER)
Admission: EM | Admit: 2016-11-27 | Discharge: 2016-11-27 | Disposition: A | Discharge status: Home / Self Care | Payer: Self-pay | Attending: Emergency Medicine | Admitting: Emergency Medicine

## 2016-11-27 ENCOUNTER — Encounter (HOSPITAL_BASED_OUTPATIENT_CLINIC_OR_DEPARTMENT_OTHER): Payer: Self-pay

## 2016-11-27 DIAGNOSIS — F17210 Nicotine dependence, cigarettes, uncomplicated: Secondary | ICD-10-CM | POA: Insufficient documentation

## 2016-11-27 DIAGNOSIS — K120 Recurrent oral aphthae: Secondary | ICD-10-CM | POA: Insufficient documentation

## 2016-11-27 MED ORDER — ACETAMINOPHEN 500 MG PO TABS
1000.0000 mg | ORAL_TABLET | Freq: Once | ORAL | Status: AC
  Administered 2016-11-27: 1000 mg via ORAL
  Filled 2016-11-27: qty 2

## 2016-11-27 NOTE — ED Notes (Signed)
Updated, no changes, alert, NAD, calm, resting comfortably.

## 2016-11-27 NOTE — ED Notes (Signed)
Alert, NAD, calm, interactive, resps e/u, speaking in clear complete sentences, no dyspnea noted, skin W&D, VSS, BP elevated, c/o 6/10 pain, (denies: sob, nausea, dizziness).

## 2016-11-27 NOTE — ED Triage Notes (Signed)
c/o pain to right upper tooth area where a tooth was extracted 1 week ago-NAD-steady gait

## 2016-11-27 NOTE — ED Provider Notes (Signed)
[facility]   CSN: [id number] Arrival date & time: 11/27/16  1827  By signing my name below, I, Linna Darner, attest that this documentation has been prepared under the direction and in the presence of physician practitioner, Lyndal Pulley, MD. Electronically Signed: Linna Darner, Scribe. 11/27/2016. 9:30 PM.  History   Chief Complaint Chief Complaint  Patient presents with  . Dental Pain    The history is provided by the patient. No language interpreter was used.  Dental Pain   This is a new problem. The current episode started more than 2 days ago. The problem occurs constantly. The problem has been gradually worsening. The pain is severe. He has tried acetaminophen, ice and rest for the symptoms. The treatment provided no relief.     HPI Comments: Andrew George is a 26 y.o. male who presents to the Emergency Department complaining of constant, gradually worsening, right upper dental pain since a tooth extraction a few days ago. He states the pain is worse with applied pressure to the area. He has used Orajel, Tylenol, ice, and ibuprofen  with no significant improvement of his pain. His last dose of Tylenol was last night. Pt denies fevers, chills, trouble swallowing, or any other associated symptoms.  Past Medical History:  Diagnosis Date  . Back pain   . Hyperlipemia   . Ureterolithiasis     There are no active problems to display for this patient.   Past Surgical History:  Procedure Laterality Date  . ADENOIDECTOMY    . SHOULDER SURGERY    . TONSILLECTOMY         Home Medications    Prior to Admission medications   Medication Sig Start Date End Date Taking? Authorizing Provider  ibuprofen (ADVIL,MOTRIN) 600 MG tablet Take 1 tablet (600 mg total) by mouth every 6 (six) hours as needed. 11/21/16   Bethel Born, PA-C    Family History No family history on file.  Social History Social History  Substance Use Topics  . Smoking  status: Current Every Day Smoker    Packs/day: 0.50    Types: Cigarettes    Last attempt to quit: 02/10/2015  . Smokeless tobacco: Former Neurosurgeon    Types: Chew  . Alcohol use No     Allergies   Ketorolac tromethamine   Review of Systems Review of Systems  Constitutional: Negative for chills and fever.  HENT: Positive for dental problem. Negative for trouble swallowing.   All other systems reviewed and are negative.  Physical Exam Updated Vital Signs BP (!) 152/92 (BP Location: Left Arm)   Pulse 84   Temp 98.5 F (36.9 C) (Oral)   Resp 18   Ht 6' (1.829 m)   Wt 300 lb (136.1 kg)   SpO2 98%   BMI 40.69 kg/m   Physical Exam  Constitutional: He is oriented to person, place, and time. He appears well-developed and well-nourished. No distress.  HENT:  Head: Normocephalic and atraumatic.  Nose: Nose normal.  3 mm aphthous ulceration over defect in gumline over removed right upper incisor.  Eyes: Conjunctivae are normal.  Neck: Neck supple. No tracheal deviation present.  Cardiovascular: Normal rate and regular rhythm.   Pulmonary/Chest: Effort normal. No respiratory distress.  Abdominal: Soft. He exhibits no distension.  Neurological: He is alert and oriented to person, place, and time.  Skin: Skin is warm and dry.  Psychiatric: He has a normal mood and affect.   ED Treatments / Results  Labs (  all labs ordered are listed, but only abnormal results are displayed) Labs Reviewed - No data to display  EKG  EKG Interpretation None       Radiology No results found.  Procedures Procedures (including critical care time)  DIAGNOSTIC STUDIES: Oxygen Saturation is 98% on RA, normal by my interpretation.    COORDINATION OF CARE: 9:34 PM Discussed treatment plan with pt at bedside and pt agreed to plan.  Medications Ordered in ED Medications  acetaminophen (TYLENOL) tablet 1,000 mg (1,000 mg Oral Given 11/27/16 2148)     Initial Impression / Assessment and Plan /  ED Course  I have reviewed the triage vital signs and the nursing notes.  Pertinent labs & imaging results that were available during my care of the patient were reviewed by me and considered in my medical decision making (see chart for details).     26 y.o. male presents with ulceration of gum after tooth extraction. Recommended NSAIDs scheduled for pain control as well as topical agents and dentistry f/u. No signs of infection or other complication. Plan to follow up with PCP as needed and return precautions discussed for worsening or new concerning symptoms.   Final Clinical Impressions(s) / ED Diagnoses  Patient with dentalgia.  No abscess requiring immediate incision and drainage.  Exam not concerning for Ludwig's angina or pharyngeal abscess.  Will treat with motrin and oragel. Pt instructed to follow-up with dentist.  Discussed return precautions. Pt safe for discharge. Final diagnoses:  Aphthous ulcer of mouth    New Prescriptions New Prescriptions   No medications on file   I personally performed the services described in this documentation, which was scribed in my presence. The recorded information has been reviewed and is accurate.      Lyndal Pulley, MD 11/28/16 [id number]

## 2016-11-27 NOTE — ED Notes (Signed)
EDP at BS 

## 2016-12-17 ENCOUNTER — Emergency Department (HOSPITAL_BASED_OUTPATIENT_CLINIC_OR_DEPARTMENT_OTHER)
Admission: EM | Admit: 2016-12-17 | Discharge: 2016-12-17 | Disposition: A | Discharge status: Home / Self Care | Payer: Self-pay | Attending: Emergency Medicine | Admitting: Emergency Medicine

## 2016-12-17 ENCOUNTER — Encounter (HOSPITAL_BASED_OUTPATIENT_CLINIC_OR_DEPARTMENT_OTHER): Payer: Self-pay | Admitting: Emergency Medicine

## 2016-12-17 DIAGNOSIS — Y929 Unspecified place or not applicable: Secondary | ICD-10-CM | POA: Insufficient documentation

## 2016-12-17 DIAGNOSIS — M5441 Lumbago with sciatica, right side: Secondary | ICD-10-CM | POA: Insufficient documentation

## 2016-12-17 DIAGNOSIS — Y999 Unspecified external cause status: Secondary | ICD-10-CM | POA: Insufficient documentation

## 2016-12-17 DIAGNOSIS — F17210 Nicotine dependence, cigarettes, uncomplicated: Secondary | ICD-10-CM | POA: Insufficient documentation

## 2016-12-17 DIAGNOSIS — Y93F2 Activity, caregiving, lifting: Secondary | ICD-10-CM | POA: Insufficient documentation

## 2016-12-17 DIAGNOSIS — X500XXA Overexertion from strenuous movement or load, initial encounter: Secondary | ICD-10-CM | POA: Insufficient documentation

## 2016-12-17 MED ORDER — PREDNISONE 20 MG PO TABS: 40.0000 mg | ORAL_TABLET | Freq: Every day | ORAL | 0 refills | Status: AC

## 2016-12-17 MED ORDER — METHOCARBAMOL 500 MG PO TABS: 500.0000 mg | ORAL_TABLET | Freq: Two times a day (BID) | ORAL | 0 refills | Status: DC

## 2016-12-17 MED ORDER — IBUPROFEN 800 MG PO TABS: 800.0000 mg | ORAL_TABLET | Freq: Three times a day (TID) | ORAL | 0 refills | Status: DC | PRN

## 2016-12-17 NOTE — ED Notes (Signed)
ED Provider at bedside. 

## 2016-12-17 NOTE — ED Triage Notes (Signed)
Bent over yesterday to pick up a cement block and pain to mid back . Hx of chronic back pain

## 2016-12-17 NOTE — ED Provider Notes (Signed)
Emergency Department Provider Note  By signing my name below, I, Avnee Patel, attest that this documentation has been prepared under the direction and in the presence of Maia Plan, MD  Electronically Signed: Clovis Pu, ED Scribe. 12/17/16. 4:06 PM.  I have reviewed the triage vital signs and the nursing notes.   HISTORY  Chief Complaint Back Pain   HPI Andrew George is a 26 y.o. male, with a PMHx of chronic back pain, complaining of acute onset, constant, moderate back pain s/p an incident which occurred yesterday. Pt states he lifted a cement block when he suddenly heard a pop from his back. His pain is worse during ambulation and upon palpation. He also reports resolved tingling/numbness to his right lower extremity. Pt states he had an episode of weakness and dizziness which caused him to fall. No alleviating factors noted. Pt denies bladder/bowel incontinence, saddle anesthesia or any other associated symptoms. No other complaints noted at this time.   Past Medical History:  Diagnosis Date  . Back pain   . Hyperlipemia   . Ureterolithiasis     There are no active problems to display for this patient.   Past Surgical History:  Procedure Laterality Date  . ADENOIDECTOMY    . SHOULDER SURGERY    . TONSILLECTOMY      Current Outpatient Rx  . Order #: [id number] Class: Print  . Order #: [id number] Class: Print  . Order #: [id number] Class: Print    Allergies Ketorolac tromethamine  No family history on file.  Social History Social History  Substance Use Topics  . Smoking status: Current Every Day Smoker    Packs/day: 0.50    Types: Cigarettes    Last attempt to quit: 02/10/2015  . Smokeless tobacco: Former Neurosurgeon    Types: Chew  . Alcohol use No    Review of Systems  Constitutional: No fever/chills Eyes: No visual changes. ENT: No sore throat. Cardiovascular: Denies chest pain. Respiratory: Denies shortness of breath. Gastrointestinal: No abdominal  pain.  No nausea, no vomiting.  No diarrhea.  No constipation. Genitourinary: Negative for dysuria. Musculoskeletal: +back pain. Skin: Negative for rash. Neurological: Negative for headaches, focal weakness or numbness.  10-point ROS otherwise negative.  ____________________________________________   PHYSICAL EXAM:  VITAL SIGNS: ED Triage Vitals  Enc Vitals Group     BP 12/17/16 1527 121/78     Pulse Rate 12/17/16 1527 90     Resp 12/17/16 1527 20     Temp 12/17/16 1527 99.1 F (37.3 C)     Temp Source 12/17/16 1527 Oral     SpO2 12/17/16 1527 96 %     Weight 12/17/16 1525 300 lb (136.1 kg)     Height 12/17/16 1525 6' (1.829 m)     Pain Score 12/17/16 1524 9   Constitutional: Alert and oriented. Appears uncomfortable but participating with exam.  Eyes: Conjunctivae are normal.  Head: Atraumatic. Nose: No congestion/rhinnorhea. Mouth/Throat: Mucous membranes are moist.  Oropharynx non-erythematous. Neck: No stridor. No cervical spine tenderness to palpation. Cardiovascular: Normal rate, regular rhythm. Good peripheral circulation. Grossly normal heart sounds.   Respiratory: Normal respiratory effort.  No retractions. Lungs CTAB. Gastrointestinal: Soft and nontender. No distention.  Musculoskeletal: No lower extremity tenderness nor edema. No gross deformities of extremities. Positive diffuse thoracic and lumbar spine tenderness both in the midline and paraspinal muscles.  Neurologic:  Normal speech and language. No gross focal neurologic deficits are appreciated. 2+ patellar reflexes bilaterally. Ambulating without significant difficulty.  Skin:  Skin is warm, dry and intact. No rash noted.  ____________________________________________   PROCEDURES  Procedure(s) performed:   Procedures  None ____________________________________________   INITIAL IMPRESSION / ASSESSMENT AND PLAN / ED COURSE  Pertinent labs & imaging results that were available during my care of  the patient were reviewed by me and considered in my medical decision making (see chart for details).  She presents to the emergency department for evaluation of acute on chronic back pain. He drove himself to the emergency department is ambulatory here. He has diffuse midline spine tenderness with some associated paraspinal discomfort. No weakness or numbness appreciated on exam. He has 2+ patellar reflexes bilaterally. Patient is tearful at times and appears uncomfortable. He has a listed allergy to Toradol but has tolerated Motrin well in the past. I do not see any objective evidence to suggest spinal cord emergency or other etiology to suggest intrabdominal for vascular etiology. Review of Care Everywhere shows an ED presentation on 4/18 to [facility] for same issue but left AMA. No physician note available for review. The patient has a history of chronic back pain and this seems similar to prior flares of acute pain. Discussed early mobility, NSAIDs, and muscle relaxer PRN for sleep and pain relief. Referred to PCP. Patient reports trying to see a spine specialist in the past.   At this time, I do not feel there is any life-threatening condition present. I have reviewed and discussed all results (EKG, imaging, lab, urine as appropriate), exam findings with patient. I have reviewed nursing notes and appropriate previous records.  I feel the patient is safe to be discharged home without further emergent workup. Discussed usual and customary return precautions. Patient and family (if present) verbalize understanding and are comfortable with this plan.  Patient will follow-up with their primary care provider. If they do not have a primary care provider, information for follow-up has been provided to them. All questions have been answered.  ____________________________________________  FINAL CLINICAL IMPRESSION(S) / ED DIAGNOSES  Final diagnoses:  Acute midline low back pain with right-sided  sciatica     MEDICATIONS GIVEN DURING THIS VISIT:  Medications - No data to display   NEW OUTPATIENT MEDICATIONS STARTED DURING THIS VISIT:  Discharge Medication List as of 12/17/2016  4:15 PM    START taking these medications   Details  methocarbamol (ROBAXIN) 500 MG tablet Take 1 tablet (500 mg total) by mouth 2 (two) times daily., Starting Sun 12/17/2016, Print    predniSONE (DELTASONE) 20 MG tablet Take 2 tablets (40 mg total) by mouth daily., Starting Sun 12/17/2016, Until Fri 12/22/2016, Print         Note:  This document was prepared using Dragon voice recognition software and may include unintentional dictation errors.  Alona Bene, MD Emergency Medicine  I personally performed the services described in this documentation, which was scribed in my presence. The recorded information has been reviewed and is accurate.       Maia Plan, MD 12/18/16 1251

## 2016-12-17 NOTE — Discharge Instructions (Signed)
You have been seen in the Emergency Department (ED)  today for back pain.  Your workup and exam have not shown any acute abnormalities and you are likely suffering from muscle strain or possible problems with your discs, but there is no treatment that will fix your symptoms at this time.  Please take Motrin (ibuprofen) as needed for your pain according to the instructions written on the box.  Alternatively, for the next five days you can take  three times daily with meals (it may upset your stomach).  Take Robaxin as prescribed for severe pain. Do not drink alcohol, drive or participate in any other potentially dangerous activities while taking this medication as it may make you sleepy. Do not take this medication with any other sedating medications, either prescription or over-the-counter.  Please follow up with your doctor as soon as possible regarding today's ED visit and your back pain.  Return to the ED for worsening back pain, fever, weakness or numbness of either leg, or if you develop either (1) an inability to urinate or have bowel movements, or (2) loss of your ability to control your bathroom functions (if you start having "accidents"), or if you develop other new symptoms that concern you.

## 2017-01-23 ENCOUNTER — Emergency Department (HOSPITAL_BASED_OUTPATIENT_CLINIC_OR_DEPARTMENT_OTHER): Payer: Self-pay

## 2017-01-23 ENCOUNTER — Emergency Department (HOSPITAL_BASED_OUTPATIENT_CLINIC_OR_DEPARTMENT_OTHER)
Admission: EM | Admit: 2017-01-23 | Discharge: 2017-01-23 | Disposition: A | Discharge status: Home / Self Care | Payer: Self-pay | Attending: Emergency Medicine | Admitting: Emergency Medicine

## 2017-01-23 ENCOUNTER — Encounter (HOSPITAL_BASED_OUTPATIENT_CLINIC_OR_DEPARTMENT_OTHER): Payer: Self-pay | Admitting: *Deleted

## 2017-01-23 DIAGNOSIS — F17210 Nicotine dependence, cigarettes, uncomplicated: Secondary | ICD-10-CM | POA: Insufficient documentation

## 2017-01-23 DIAGNOSIS — R109 Unspecified abdominal pain: Secondary | ICD-10-CM | POA: Insufficient documentation

## 2017-01-23 LAB — URINALYSIS, ROUTINE W REFLEX MICROSCOPIC
Bilirubin Urine: NEGATIVE
GLUCOSE, UA: NEGATIVE mg/dL
HGB URINE DIPSTICK: NEGATIVE
KETONES UR: NEGATIVE mg/dL
LEUKOCYTES UA: NEGATIVE
Nitrite: NEGATIVE
PROTEIN: NEGATIVE mg/dL
Specific Gravity, Urine: 1.026 (ref 1.005–1.030)
pH: 6.5 (ref 5.0–8.0)

## 2017-01-23 MED ORDER — ACETAMINOPHEN 500 MG PO TABS: 500.0000 mg | ORAL_TABLET | ORAL | 0 refills | Status: DC | PRN

## 2017-01-23 MED ORDER — METHOCARBAMOL 500 MG PO TABS
500.0000 mg | ORAL_TABLET | Freq: Once | ORAL | Status: AC
Start: 2017-01-23 — End: 2017-01-23
  Administered 2017-01-23: 500 mg via ORAL
  Filled 2017-01-23: qty 1

## 2017-01-23 MED ORDER — OXYCODONE-ACETAMINOPHEN 5-325 MG PO TABS
1.0000 | ORAL_TABLET | Freq: Once | ORAL | Status: AC
  Administered 2017-01-23: 1 via ORAL
  Filled 2017-01-23: qty 1

## 2017-01-23 MED ORDER — METHOCARBAMOL 500 MG PO TABS: 500.0000 mg | ORAL_TABLET | Freq: Three times a day (TID) | ORAL | 0 refills | Status: DC | PRN

## 2017-01-23 NOTE — ED Notes (Signed)
Patient transported to CT 

## 2017-01-23 NOTE — ED Provider Notes (Signed)
[facility]   CSN: [id number] Arrival date & time: 01/23/17  1400     History   Chief Complaint Chief Complaint  Patient presents with  . Flank Pain    HPI Andrew George is a 26 y.o. male.  HPI Patient presents emergency department with complaints of left flank pain with some radiation to his left abdomen intermittently over the past week.  He states is worse with movement and palpation.  No dysuria or urinary frequency.  Pain is moderate to severe in severity.  At times he feels like spasms.  No injury or trauma.   Past Medical History:  Diagnosis Date  . Back pain   . Hyperlipemia   . Ureterolithiasis     There are no active problems to display for this patient.   Past Surgical History:  Procedure Laterality Date  . ADENOIDECTOMY    . SHOULDER SURGERY    . TONSILLECTOMY         Home Medications    Prior to Admission medications   Medication Sig Start Date End Date Taking? Authorizing Provider  acetaminophen (TYLENOL) 500 MG tablet Take 1 tablet (500 mg total) by mouth every 4 (four) hours as needed. 01/23/17   Azalia Bilisampos, Portia Wisdom, MD  ibuprofen (ADVIL,MOTRIN) 800 MG tablet Take 1 tablet (800 mg total) by mouth every 8 (eight) hours as needed. 12/17/16   Long, Arlyss RepressJoshua G, MD  methocarbamol (ROBAXIN) 500 MG tablet Take 1 tablet (500 mg total) by mouth every 8 (eight) hours as needed for muscle spasms. 01/23/17   Azalia Bilisampos, Maylen Waltermire, MD    Family History History reviewed. No pertinent family history.  Social History Social History  Substance Use Topics  . Smoking status: Current Every Day Smoker    Packs/day: 0.50    Types: Cigarettes    Last attempt to quit: 02/10/2015  . Smokeless tobacco: Former NeurosurgeonUser    Types: Chew  . Alcohol use No     Allergies   Ketorolac tromethamine   Review of Systems Review of Systems  All other systems reviewed and are negative.    Physical Exam Updated Vital Signs BP 123/70   Pulse 65   Temp 98.1 F  (36.7 C)   Resp 16   Ht 6' (1.829 m)   Wt 136.1 kg (300 lb)   SpO2 98%   BMI 40.69 kg/m   Physical Exam  Constitutional: He is oriented to person, place, and time. He appears well-developed and well-nourished.  HENT:  Head: Normocephalic and atraumatic.  Eyes: EOM are normal.  Neck: Normal range of motion.  Cardiovascular: Normal rate, regular rhythm, normal heart sounds and intact distal pulses.   Pulmonary/Chest: Effort normal and breath sounds normal. No respiratory distress.  Abdominal: Soft. He exhibits no distension. There is no tenderness.  Musculoskeletal: Normal range of motion.  No thoracic or lumbar point tenderness.  Mild parathoracic and paralumbar tenderness on the left without skin changes  Neurological: He is alert and oriented to person, place, and time.  Skin: Skin is warm and dry.  Psychiatric: He has a normal mood and affect. Judgment normal.  Nursing note and vitals reviewed.    ED Treatments / Results  Labs (all labs ordered are listed, but only abnormal results are displayed) Labs Reviewed  URINALYSIS, ROUTINE W REFLEX MICROSCOPIC    EKG  EKG Interpretation None       Radiology Ct Renal Stone Study  Result Date: 01/23/2017 CLINICAL DATA:  LEFT flank pain for  1 week increased in past 2 days, history of kidney stones, smoking EXAM: CT ABDOMEN AND PELVIS WITHOUT CONTRAST TECHNIQUE: Multidetector CT imaging of the abdomen and pelvis was performed following the standard protocol without IV contrast. Sagittal and coronal MPR images reconstructed from axial data set. Oral contrast not administered for this indication. COMPARISON:  12/07/2014 FINDINGS: Lower chest: Lung bases clear Hepatobiliary: Gallbladder and liver normal appearance Pancreas: Normal appearance Spleen: 13.8 cm length, unchanged, without focal abnormality Adrenals/Urinary Tract: Adrenal glands normal appearance. Tiny nonobstructing BILATERAL renal calculi. Multiple poorly defined  low-attenuation foci in both kidneys, question cysts, noted on prior exam, largest 2.2 cm LEFT and 1.7 cm RIGHT not significantly changed. No hydronephrosis or hydroureter. No ureteral calcification. Bladder unremarkable. Stomach/Bowel: Normal appendix. Stomach and bowel loops grossly normal appearance for exam lacking IV and oral contrast. Vascular/Lymphatic: Aorta normal caliber.  No adenopathy. Reproductive: Unremarkable prostate gland and seminal vesicles. Other: No free air or free fluid. No hernia or definite inflammatory process. Musculoskeletal: Unremarkable IMPRESSION: Tiny BILATERAL nonobstructing renal calculi. Small BILATERAL renal cysts. No definite acute intra-abdominal or intrapelvic abnormalities. Electronically Signed   By: Ulyses Southward M.D.   On: 01/23/2017 15:34    Procedures Procedures (including critical care time)  Medications Ordered in ED Medications  oxyCODONE-acetaminophen (PERCOCET/ROXICET) 5-325 MG per tablet 1 tablet (1 tablet Oral Given 01/23/17 1539)  methocarbamol (ROBAXIN) tablet 500 mg (500 mg Oral Given 01/23/17 1515)     Initial Impression / Assessment and Plan / ED Course  I have reviewed the triage vital signs and the nursing notes.  Pertinent labs & imaging results that were available during my care of the patient were reviewed by me and considered in my medical decision making (see chart for details).     Likely musculoskeletal back pain.  CT renal study without ureteral stone.  Discharge home in good condition.  No signs of infection.  Anterior abdominal exam is benign  Final Clinical Impressions(s) / ED Diagnoses   Final diagnoses:  Acute flank pain    New Prescriptions New Prescriptions   ACETAMINOPHEN (TYLENOL) 500 MG TABLET    Take 1 tablet (500 mg total) by mouth every 4 (four) hours as needed.   METHOCARBAMOL (ROBAXIN) 500 MG TABLET    Take 1 tablet (500 mg total) by mouth every 8 (eight) hours as needed for muscle spasms.     Azalia Bilis, MD 01/23/17 1620

## 2017-01-23 NOTE — ED Triage Notes (Signed)
Pt c/o left flank pain x 1 week  

## 2017-01-23 NOTE — ED Notes (Signed)
Family at bedside. 

## 2017-02-21 ENCOUNTER — Emergency Department (HOSPITAL_BASED_OUTPATIENT_CLINIC_OR_DEPARTMENT_OTHER)
Admission: EM | Admit: 2017-02-21 | Discharge: 2017-02-21 | Disposition: A | Discharge status: Home / Self Care | Payer: Managed Care, Other (non HMO) | Attending: Emergency Medicine | Admitting: Emergency Medicine

## 2017-02-21 ENCOUNTER — Encounter (HOSPITAL_BASED_OUTPATIENT_CLINIC_OR_DEPARTMENT_OTHER): Payer: Self-pay

## 2017-02-21 DIAGNOSIS — F17210 Nicotine dependence, cigarettes, uncomplicated: Secondary | ICD-10-CM | POA: Insufficient documentation

## 2017-02-21 DIAGNOSIS — R509 Fever, unspecified: Secondary | ICD-10-CM | POA: Diagnosis not present

## 2017-02-21 DIAGNOSIS — R69 Illness, unspecified: Secondary | ICD-10-CM

## 2017-02-21 DIAGNOSIS — R05 Cough: Secondary | ICD-10-CM | POA: Insufficient documentation

## 2017-02-21 DIAGNOSIS — R112 Nausea with vomiting, unspecified: Secondary | ICD-10-CM | POA: Diagnosis not present

## 2017-02-21 DIAGNOSIS — R52 Pain, unspecified: Secondary | ICD-10-CM | POA: Diagnosis not present

## 2017-02-21 DIAGNOSIS — J111 Influenza due to unidentified influenza virus with other respiratory manifestations: Secondary | ICD-10-CM

## 2017-02-21 MED ORDER — PROMETHAZINE HCL 25 MG PO TABS: 25.0000 mg | ORAL_TABLET | Freq: Four times a day (QID) | ORAL | 0 refills | Status: AC | PRN

## 2017-02-21 MED ORDER — BENZONATATE 100 MG PO CAPS: 100.0000 mg | ORAL_CAPSULE | Freq: Three times a day (TID) | ORAL | 0 refills | Status: AC

## 2017-02-21 MED FILL — BENZONATATE 100 MG CAP: 100 | 7 days supply | Qty: 21 | Fill #0

## 2017-02-21 MED FILL — PROMETHAZINE 25 MG TABLET: 25 | 3 days supply | Qty: 12 | Fill #0

## 2017-02-21 NOTE — ED Triage Notes (Signed)
C/o flu like sx and n/v x 2 days-NAD-steady gait

## 2017-02-21 NOTE — ED Provider Notes (Signed)
[facility]   CSN: [id number] Arrival date & time: 02/21/17  1449     History   Chief Complaint Chief Complaint  Patient presents with  . Cough    HPI Andrew George is a 26 y.o. male.  HPI   26 year old male presenting with flulike symptoms. Patient report for the past 2 days he has had fever, chills, mild body aches, nonproductive cough, feeling nauseous, vomited twice of nonbloody nonbilious content with normal bowel movement. He admits to recent sick contact with son with similar symptoms. He has been drinking plenty of fluid at home. He is missing work and therefore decided to come to ER for work note. She denies any severe headache, abdominal pain, back pain, dysuria, or rash. No other complaint.  Past Medical History:  Diagnosis Date  . Back pain   . Hyperlipemia   . Ureterolithiasis     There are no active problems to display for this patient.   Past Surgical History:  Procedure Laterality Date  . ADENOIDECTOMY    . SHOULDER SURGERY    . TONSILLECTOMY         Home Medications    Prior to Admission medications   Not on File    Family History No family history on file.  Social History Social History  Substance Use Topics  . Smoking status: Current Every Day Smoker    Packs/day: 0.50    Types: Cigarettes  . Smokeless tobacco: Current User    Types: Chew  . Alcohol use No     Allergies   Ketorolac tromethamine   Review of Systems Review of Systems  All other systems reviewed and are negative.    Physical Exam Updated Vital Signs Ht 6' (1.829 m)   Wt 133.8 kg (295 lb)   BMI 40.01 kg/m   Physical Exam  Constitutional: He is oriented to person, place, and time. He appears well-developed and well-nourished. No distress.  Nontoxic in appearance  HENT:  Head: Atraumatic.  Ears: TMs normal bilaterally Nose: Normal nares Throat: Uvula is midline no tonsillar enlargement or exudates  Eyes: Conjunctivae are  normal.  Neck: Neck supple.  No nuchal rigidity  Cardiovascular: Normal rate and regular rhythm.   Pulmonary/Chest: Effort normal and breath sounds normal.  Abdominal: Soft. Bowel sounds are normal. He exhibits no distension. There is no tenderness.  Neurological: He is alert and oriented to person, place, and time.  Skin: No rash noted.  Psychiatric: He has a normal mood and affect.  Nursing note and vitals reviewed.    ED Treatments / Results  Labs (all labs ordered are listed, but only abnormal results are displayed) Labs Reviewed - No data to display  EKG  EKG Interpretation None       Radiology No results found.  Procedures Procedures (including critical care time)  Medications Ordered in ED Medications - No data to display   Initial Impression / Assessment and Plan / ED Course  I have reviewed the triage vital signs and the nursing notes.  Pertinent labs & imaging results that were available during my care of the patient were reviewed by me and considered in my medical decision making (see chart for details).     BP 124/80 (BP Location: Right Arm)   Pulse 95   Temp 100.1 F (37.8 C) (Oral)   Resp 18   Ht 6' (1.829 m)   Wt 133.8 kg (295 lb)   SpO2 98%   BMI 40.01 kg/m  Final Clinical Impressions(s) / ED Diagnoses   Final diagnoses:  Influenza-like illness    New Prescriptions New Prescriptions   BENZONATATE (TESSALON) 100 MG CAPSULE    Take 1 capsule (100 mg total) by mouth every 8 (eight) hours.   PROMETHAZINE (PHENERGAN) 25 MG TABLET    Take 1 tablet (25 mg total) by mouth every 6 (six) hours as needed for nausea or vomiting.   3:12 PM Patient here with cold symptoms. Lungs are clear, low suspicion for pneumonia. No hypoxia. No nuchal rigidity concerning for meningitis. He has some nausea and vomiting but he has a benign abdominal exam. Will provide symptomatic treatment for this condition. Work note provided as requested. Return precaution  discussed.   Fayrene Helper, PA-C 02/21/17 1520    Rolland Porter, MD 03/02/17 Nicholos Johns

## 2017-05-02 ENCOUNTER — Encounter (HOSPITAL_BASED_OUTPATIENT_CLINIC_OR_DEPARTMENT_OTHER): Payer: Self-pay | Admitting: Emergency Medicine

## 2017-05-02 ENCOUNTER — Emergency Department (HOSPITAL_BASED_OUTPATIENT_CLINIC_OR_DEPARTMENT_OTHER)
Admission: EM | Admit: 2017-05-02 | Discharge: 2017-05-02 | Disposition: A | Discharge status: Home / Self Care | Payer: 59 | Attending: Emergency Medicine | Admitting: Emergency Medicine

## 2017-05-02 DIAGNOSIS — F17210 Nicotine dependence, cigarettes, uncomplicated: Secondary | ICD-10-CM | POA: Insufficient documentation

## 2017-05-02 DIAGNOSIS — E876 Hypokalemia: Secondary | ICD-10-CM | POA: Insufficient documentation

## 2017-05-02 DIAGNOSIS — R110 Nausea: Secondary | ICD-10-CM | POA: Diagnosis present

## 2017-05-02 DIAGNOSIS — Z79899 Other long term (current) drug therapy: Secondary | ICD-10-CM | POA: Diagnosis not present

## 2017-05-02 DIAGNOSIS — R509 Fever, unspecified: Secondary | ICD-10-CM | POA: Insufficient documentation

## 2017-05-02 LAB — COMPREHENSIVE METABOLIC PANEL
ALBUMIN: 3.9 g/dL (ref 3.5–5.0)
ALT: 22 U/L (ref 17–63)
AST: 24 U/L (ref 15–41)
Alkaline Phosphatase: 75 U/L (ref 38–126)
Anion gap: 8 (ref 5–15)
CHLORIDE: 101 mmol/L (ref 101–111)
CO2: 26 mmol/L (ref 22–32)
CREATININE: 0.65 mg/dL (ref 0.61–1.24)
Calcium: 8.8 mg/dL — ABNORMAL LOW (ref 8.9–10.3)
GFR calc Af Amer: >60 mL/min (ref >60)
Glucose, Bld: 127 mg/dL — ABNORMAL HIGH (ref 65–99)
POTASSIUM: 2.7 mmol/L — AB (ref 3.5–5.1)
SODIUM: 135 mmol/L (ref 135–145)
Total Bilirubin: 1.1 mg/dL (ref 0.3–1.2)
Total Protein: 7.3 g/dL (ref 6.5–8.1)

## 2017-05-02 LAB — URINALYSIS, ROUTINE W REFLEX MICROSCOPIC
Glucose, UA: NEGATIVE mg/dL
Hgb urine dipstick: NEGATIVE
KETONES UR: NEGATIVE mg/dL
LEUKOCYTES UA: NEGATIVE
Nitrite: NEGATIVE
PH: 6.5 (ref 5.0–8.0)
PROTEIN: NEGATIVE mg/dL
Specific Gravity, Urine: 1.02 (ref 1.005–1.030)

## 2017-05-02 LAB — CBC
HEMATOCRIT: 36.5 % — AB (ref 39.0–52.0)
HEMOGLOBIN: 12.3 g/dL — AB (ref 13.0–17.0)
MCH: 27.4 pg (ref 26.0–34.0)
MCHC: 33.7 g/dL (ref 30.0–36.0)
MCV: 81.3 fL (ref 78.0–100.0)
Platelets: 225 10*3/uL (ref 150–400)
RBC: 4.49 MIL/uL (ref 4.22–5.81)
RDW: 13.3 % (ref 11.5–15.5)
WBC: 13.4 10*3/uL — AB (ref 4.0–10.5)

## 2017-05-02 LAB — LIPASE, BLOOD: LIPASE: 39 U/L (ref 11–51)

## 2017-05-02 MED ORDER — ONDANSETRON HCL 4 MG/2ML IJ SOLN
4.0000 mg | Freq: Once | INTRAMUSCULAR | Status: AC
  Administered 2017-05-02: 4 mg via INTRAVENOUS
  Filled 2017-05-02: qty 2

## 2017-05-02 MED ORDER — POTASSIUM CHLORIDE CRYS ER 20 MEQ PO TBCR
40.0000 meq | EXTENDED_RELEASE_TABLET | Freq: Once | ORAL | Status: AC
  Administered 2017-05-02: 40 meq via ORAL
  Filled 2017-05-02: qty 2

## 2017-05-02 MED ORDER — ONDANSETRON 4 MG PO TBDP: 4.0000 mg | ORAL_TABLET | Freq: Three times a day (TID) | ORAL | 0 refills | Status: AC | PRN

## 2017-05-02 MED ORDER — SODIUM CHLORIDE 0.9 % IV BOLUS (SEPSIS)
1000.0000 mL | Freq: Once | INTRAVENOUS | Status: AC
  Administered 2017-05-02: 1000 mL via INTRAVENOUS

## 2017-05-02 MED ORDER — POTASSIUM CHLORIDE ER 10 MEQ PO TBCR: 10.0000 meq | EXTENDED_RELEASE_TABLET | Freq: Every day | ORAL | 0 refills | Status: AC

## 2017-05-02 NOTE — ED Triage Notes (Signed)
Patient states that he has had nausea since Sunday. Reports that he is having pain to his generalized stomach area. The patient has had dry heaves since sunday

## 2017-05-02 NOTE — ED Provider Notes (Signed)
[facility] Provider [facility]   CSN: [id number] Arrival date & time: 05/02/17  1419     History   Chief Complaint Chief Complaint  Patient presents with  . Nausea    HPI Andrew George is a 26 y.o. male.  26yo M w/ PMH below who p/w nausea. He began having nausea w/ dry heaves 3 days ago and his symptoms have intermittently persisted since then. He has generalized central abdominal pain when he is dry heaving but denies any pain at rest, no pain currently. No associated diarrhea, urinary symptoms, bloody stools, or cough/cold sx. He developed fever 2 days ago and had T 102.7 today for which he took ibuprofen prior to arrival. No recent travel. He does report son had "stomach virus" that he subsequently gave to patient's father and mother recently. No drug or alcohol use.   The history is provided by the patient.    Past Medical History:  Diagnosis Date  . Back pain   . Hyperlipemia   . Ureterolithiasis     There are no active problems to display for this patient.   Past Surgical History:  Procedure Laterality Date  . ADENOIDECTOMY    . SHOULDER SURGERY    . TONSILLECTOMY         Home Medications    Prior to Admission medications   Medication Sig Start Date End Date Taking? Authorizing Provider  benzonatate (TESSALON) 100 MG capsule Take 1 capsule (100 mg total) by mouth every 8 (eight) hours. 02/21/17   Fayrene Helper, PA-C  ondansetron (ZOFRAN ODT) 4 MG disintegrating tablet Take 1 tablet (4 mg total) by mouth every 8 (eight) hours as needed for nausea or vomiting. 05/02/17   Little, Ambrose Finland, MD  potassium chloride (K-DUR) 10 MEQ tablet Take 1 tablet (10 mEq total) by mouth daily. 05/02/17   Little, Ambrose Finland, MD  promethazine (PHENERGAN) 25 MG tablet Take 1 tablet (25 mg total) by mouth every 6 (six) hours as needed for nausea or vomiting. 02/21/17   Fayrene Helper, PA-C    Family History History reviewed. No pertinent family history.  Social  History Social History  Substance Use Topics  . Smoking status: Current Every Day Smoker    Packs/day: 0.50    Types: Cigarettes  . Smokeless tobacco: Current User    Types: Chew  . Alcohol use No     Allergies   Ketorolac tromethamine   Review of Systems Review of Systems All other systems reviewed and are negative except that which was mentioned in HPI   Physical Exam Updated Vital Signs BP 122/70   Pulse 94   Temp 98.5 F (36.9 C) (Oral)   Resp (!) 24   Ht 6\' 1"  (1.854 m)   Wt 126.1 kg (278 lb)   SpO2 99%   BMI 36.68 kg/m   Physical Exam  Constitutional: He is oriented to person, place, and time. He appears well-developed and well-nourished. No distress.  HENT:  Head: Normocephalic and atraumatic.  Mouth/Throat: Oropharynx is clear and moist.  Moist mucous membranes  Eyes: Pupils are equal, round, and reactive to light. Conjunctivae are normal.  Neck: Neck supple.  Cardiovascular: Regular rhythm and normal heart sounds.  Tachycardia present.   No murmur heard. Pulmonary/Chest: Effort normal and breath sounds normal.  Abdominal: Soft. Bowel sounds are normal. He exhibits no distension. There is no tenderness.  Musculoskeletal: He exhibits no edema.  Neurological: He is alert and oriented to person, place, and time.  Fluent speech  Skin: Skin is warm and dry.  Psychiatric: He has a normal mood and affect. Judgment normal.  Nursing note and vitals reviewed.    ED Treatments / Results  Labs (all labs ordered are listed, but only abnormal results are displayed) Labs Reviewed  COMPREHENSIVE METABOLIC PANEL - Abnormal; Notable for the following:       Result Value   Potassium 2.7 (*)    Glucose, Bld 127 (*)    BUN <5 (*)    Calcium 8.8 (*)    All other components within normal limits  CBC - Abnormal; Notable for the following:    WBC 13.4 (*)    Hemoglobin 12.3 (*)    HCT 36.5 (*)    All other components within normal limits  URINALYSIS, ROUTINE W  REFLEX MICROSCOPIC - Abnormal; Notable for the following:    Bilirubin Urine SMALL (*)    All other components within normal limits  LIPASE, BLOOD    EKG  EKG Interpretation  Date/Time:  Wednesday May 02 2017 16:33:35 EDT Ventricular Rate:  110 PR Interval:    QRS Duration: 93 QT Interval:  336 QTC Calculation: 455 R Axis:   50 Text Interpretation:  Sinus tachycardia Probable lateral infarct, old inverted T waves inferiorly, no previous tracing for comparison Confirmed by Frederick Peers [phone]) on 05/02/2017 5:36:54 PM       Radiology No results found.  Procedures Procedures (including critical care time)  Medications Ordered in ED Medications  sodium chloride 0.9 % bolus 1,000 mL (0 mLs Intravenous Stopped 05/02/17 1738)  potassium chloride SA (K-DUR,KLOR-CON) CR tablet 40 mEq (40 mEq Oral Given 05/02/17 1635)  ondansetron (ZOFRAN) injection 4 mg (4 mg Intravenous Given 05/02/17 1634)  sodium chloride 0.9 % bolus 1,000 mL (0 mLs Intravenous Stopped 05/02/17 1738)     Initial Impression / Assessment and Plan / ED Course  I have reviewed the triage vital signs and the nursing notes.  Pertinent labs that were available during my care of the patient were reviewed by me and considered in my medical decision making (see chart for details).     PT w/ several days of nausea and dry heaves, intermittent fevers including earlier today. Son has been ill recently with stomach bug and the patient's parents have also had the same illness recently. He was comfortable on exam with no major complaints. He was afebrile here but had taken ibuprofen prior to arrival. He was mildly tachycardic but no evidence of severe dehydration on exam. No abdominal tenderness. Gave IV fluids and Zofran. His labs show potassium of 2.7, normal LFTs and lipase, WBC 13.4. Because he has no abdominal tenderness and no complaints of abdominal pain today, I do not feel he needs any abdominal imaging at this time.  Because of his sick contacts, it is certainly possible that he has a similar self-limited illness. He denies any tick bites or tick exposures.  His tachycardia improved with fluids and on reexamination he states he feels much better and is no longer nauseated. He has tolerated liquids here with no vomiting.  I discussed return precautions including persistent abdominal pain, worsening fevers, or intractable vomiting. Patient voiced understanding and was discharged in satisfactory condition.  Final Clinical Impressions(s) / ED Diagnoses   Final diagnoses:  Nausea  Fever, unspecified fever cause  Hypokalemia    New Prescriptions New Prescriptions   ONDANSETRON (ZOFRAN ODT) 4 MG DISINTEGRATING TABLET    Take 1 tablet (4 mg total) by mouth  every 8 (eight) hours as needed for nausea or vomiting.   POTASSIUM CHLORIDE (K-DUR) 10 MEQ TABLET    Take 1 tablet (10 mEq total) by mouth daily.     Little, Ambrose Finlandachel Morgan, MD 05/02/17 [phone]

## 2017-05-02 NOTE — ED Triage Notes (Signed)
Patient took 600 mg of motrin prior to arrival for  Fever of 102

## 2017-05-02 NOTE — ED Notes (Signed)
Pt on monitor 

## 2017-05-02 NOTE — ED Notes (Signed)
Pt ambulatory to BR without need for assist

## 2017-08-12 ENCOUNTER — Emergency Department (HOSPITAL_BASED_OUTPATIENT_CLINIC_OR_DEPARTMENT_OTHER)
Admission: EM | Admit: 2017-08-12 | Discharge: 2017-08-12 | Disposition: A | Discharge status: Home / Self Care | Payer: 59 | Attending: Physician Assistant | Admitting: Physician Assistant

## 2017-08-12 ENCOUNTER — Other Ambulatory Visit: Payer: Self-pay

## 2017-08-12 ENCOUNTER — Encounter (HOSPITAL_BASED_OUTPATIENT_CLINIC_OR_DEPARTMENT_OTHER): Payer: Self-pay | Admitting: *Deleted

## 2017-08-12 DIAGNOSIS — F17210 Nicotine dependence, cigarettes, uncomplicated: Secondary | ICD-10-CM | POA: Insufficient documentation

## 2017-08-12 DIAGNOSIS — Z79899 Other long term (current) drug therapy: Secondary | ICD-10-CM | POA: Insufficient documentation

## 2017-08-12 DIAGNOSIS — M546 Pain in thoracic spine: Secondary | ICD-10-CM | POA: Insufficient documentation

## 2017-08-12 MED ORDER — PREDNISONE 10 MG PO TABS: 40.0000 mg | ORAL_TABLET | Freq: Every day | ORAL | 0 refills | Status: AC

## 2017-08-12 MED ORDER — PREDNISONE 50 MG PO TABS
60.0000 mg | ORAL_TABLET | Freq: Once | ORAL | Status: AC
  Administered 2017-08-12: 22:00:00 60 mg via ORAL
  Filled 2017-08-12: qty 1

## 2017-08-12 MED ORDER — PREDNISONE 10 MG PO TABS: 40.0000 mg | ORAL_TABLET | Freq: Two times a day (BID) | ORAL | 0 refills | Status: DC

## 2017-08-12 MED ORDER — CYCLOBENZAPRINE HCL 5 MG PO TABS
5.0000 mg | ORAL_TABLET | Freq: Once | ORAL | Status: AC
  Administered 2017-08-12: 5 mg via ORAL
  Filled 2017-08-12: qty 1

## 2017-08-12 MED ORDER — CYCLOBENZAPRINE HCL 10 MG PO TABS: 10.0000 mg | ORAL_TABLET | Freq: Two times a day (BID) | ORAL | 0 refills | Status: AC | PRN

## 2017-08-12 NOTE — ED Provider Notes (Signed)
[facility] EMERGENCY DEPARTMENT Provider Note   CSN: [id number] Arrival date & time: 08/12/17  [id number]     History   Chief Complaint Chief Complaint  Patient presents with  . Back Pain    HPI Andrew George is a 26 y.o. male with history of chronic back pain, hyperlipidemia presents today with chief complaint gradual onset, progressively worsening thoracic back pain for the past 4 days.  He notes that when he awoke on Friday morning he noted aching pain to his thoracic spine with radiation bilaterally.  Pain worsens and becomes sharp with ambulation and bending.  He denies any known trauma or falls for reevaluation of breast pain but states that he thinks that he may have injured his back while he was sleeping.  Pain pain at times radiates down the bilateral lower extremities with tingling to his feet.  He denies saddle anesthesia, bowel or bladder incontinence, IV drug use, fevers, chills, or prior history of cancer.  He states this feels like his usual back pain but is slightly worse.  Currently rates it as 8/10 in severity.  He has tried Naprosyn without significant relief of his symptoms.    The history is provided by the patient.    Past Medical History:  Diagnosis Date  . Back pain   . Hyperlipemia   . Ureterolithiasis     There are no active problems to display for this patient.   Past Surgical History:  Procedure Laterality Date  . ADENOIDECTOMY    . SHOULDER SURGERY    . TONSILLECTOMY         Home Medications    Prior to Admission medications   Medication Sig Start Date End Date Taking? Authorizing Provider  Calcium & Magnesium Carbonates (MYLANTA PO) Take by mouth.   Yes [provider]  diphenhydrAMINE (BENADRYL) 25 mg capsule Take 25 mg by mouth every 6 (six) hours as needed.   Yes [provider]  docusate sodium (COLACE) 100 MG capsule Take 100 mg by mouth 2 (two) times daily.   Yes [provider]  ibuprofen  (ADVIL,MOTRIN) 200 MG tablet Take 400 mg by mouth every 8 (eight) hours as needed.   Yes [provider]  loperamide (IMODIUM) 2 MG capsule Take 2 mg by mouth as needed for diarrhea or loose stools.   Yes [provider]  magnesium citrate SOLN Take 1 Bottle by mouth once.   Yes [provider]  magnesium hydroxide (MILK OF MAGNESIA) 400 MG/5ML suspension Take 30 mLs by mouth daily as needed for mild constipation.   Yes [provider]  Multiple Vitamins-Minerals (MULTIVITAMIN WITH MINERALS) tablet Take 1 tablet by mouth daily.   Yes [provider]  naproxen sodium (ALEVE) 220 MG tablet Take 220 mg by mouth every 8 (eight) hours.   Yes [provider]  nicotine (NICODERM CQ - DOSED IN MG/24 HOURS) 21 mg/24hr patch Place 21 mg onto the skin daily.   Yes [provider]  benzonatate (TESSALON) 100 MG capsule Take 1 capsule (100 mg total) by mouth every 8 (eight) hours. 02/21/17   Fayrene Helperran, Bow[place], PA-C  cyclobenzaprine (FLEXERIL) 10 MG tablet Take 1 tablet (10 mg total) by mouth 2 (two) times daily as needed for muscle spasms. 08/12/17   Luevenia MaxinFawze, Dhanvi Boesen A, PA-C  ondansetron (ZOFRAN ODT) 4 MG disintegrating tablet Take 1 tablet (4 mg total) by mouth every 8 (eight) hours as needed for nausea or vomiting. 05/02/17   Little, Ambrose Finlandachel Morgan,  MD  potassium chloride (K-DUR) 10 MEQ tablet Take 1 tablet (10 mEq total) by mouth daily. 05/02/17   Little, Ambrose Finland, MD  predniSONE (DELTASONE) 10 MG tablet Take 4 tablets (40 mg total) by mouth daily with breakfast for 5 days. 08/12/17 08/17/17  Michela Pitcher A, PA-C  promethazine (PHENERGAN) 25 MG tablet Take 1 tablet (25 mg total) by mouth every 6 (six) hours as needed for nausea or vomiting. 02/21/17   Fayrene Helper, PA-C    Family History No family history on file.  Social History Social History   Tobacco Use  . Smoking status: Current Every Day Smoker    Packs/day: 0.50    Types: Cigarettes  . Smokeless  tobacco: Current User    Types: Chew  Substance Use Topics  . Alcohol use: No  . 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  Respiratory: Negative for shortness of breath.   Cardiovascular: Negative for chest pain.  Genitourinary:       No bowel or bladder incontinence  Musculoskeletal: Positive for back pain. Negative for myalgias and neck pain.  Neurological: Negative for syncope, weakness and numbness.     Physical Exam Updated Vital Signs BP (!) 152/105 (BP Location: Right Wrist)   Pulse 90   Temp 98.2 F (36.8 C) (Oral)   Resp 18   Ht 6' (1.829 m)   Wt 122.9 kg (271 lb)   SpO2 100%   BMI 36.75 kg/m   Physical Exam  Constitutional: He is oriented to person, place, and time. He appears well-developed and well-nourished. No distress.  Resting comfortably in bed  HENT:  Head: Normocephalic and atraumatic.  Eyes: Conjunctivae are normal. Right eye exhibits no discharge. Left eye exhibits no discharge.  Neck: Normal range of motion. Neck supple. No JVD present. No tracheal deviation present.  Cardiovascular: Normal rate, regular rhythm, normal heart sounds and intact distal pulses.  Pulmonary/Chest: Effort normal and breath sounds normal.  Abdominal: Soft. Bowel sounds are normal.  Musculoskeletal: He exhibits tenderness. He exhibits no edema.  Diffuse thoracic spine and parathoracic muscle tenderness with no focal tenderness.  No deformity, crepitus, or step-off noted.  No lumbar spine tenderness or cervical spine tenderness.  Pain worsens with flexion and extension as well as lateral rotation.  5/5 strength of BUE and BLE major muscle groups.  Neurological: He is alert and oriented to person, place, and time. No sensory deficit. He exhibits normal muscle tone.  Fluent speech, no facial droop, sensation intact to soft touch of extremities,  normal gait, and patient able to heel walk and toe walk without difficulty.   Skin: Skin is warm and dry. No erythema.  Psychiatric: He has a normal mood and affect. His behavior is normal.  Nursing note and vitals reviewed.    ED Treatments / Results  Labs (all labs ordered are listed, but only abnormal results are displayed) Labs Reviewed - No data to display  EKG  EKG Interpretation None       Radiology No results found.  Procedures Procedures (including critical care time)  Medications Ordered in ED Medications  predniSONE (DELTASONE) tablet 60 mg (60 mg Oral Given 08/12/17 2153)  cyclobenzaprine (FLEXERIL) tablet 5 mg (5 mg Oral Given 08/12/17 2153)     Initial Impression / Assessment and Plan / ED Course  I have reviewed  the triage vital signs and the nursing notes.  Pertinent labs & imaging results that were available during my care of the patient were reviewed by me and considered in my medical decision making (see chart for details).   Patient with acute on chronic worsening of thoracic back pain.  Afebrile, vital signs are at patient's baseline.  No focal neurological deficits.  No red flag signs concerning for cauda equina, discitis, or spinal abscess.  Ambulatory without difficulty although it is somewhat painful.  Radiographs not indicated at this time.  Doubt acute cardiopulmonary abnormality such as pneumonia or PE.  Pain appears to be musculoskeletal in etiology. RICE therapy indicated and discussed with patient.  Will discharge with prednisone pack, Flexeril for muscle spasms, and Tylenol.  He will follow-up with his primary care physician for reevaluation.  Discussed indications for return to the ED. Pt verbalized understanding of and agreement with plan and is safe for discharge at this time.  He has no complaints prior to discharge.   Final Clinical Impressions(s) / ED Diagnoses   Final diagnoses:  Acute bilateral thoracic back pain      Jeanie SewerFawze,  Billy Rocco A, PA-C 08/13/17 0104  Abelino DerrickMackuen, Courteney Lyn, MD 08/14/17 1031

## 2017-08-12 NOTE — Discharge Instructions (Signed)
Start taking prednisone as prescribed beginning tomorrow.  You received the first dose in the ED today.  You may take [id number] mg of Tylenol every 6 hours as needed while taking this but do not take ibuprofen or Naprosyn.  You may take Flexeril up to twice daily as needed for muscle spasms. This medication may make you drowsy, so I typically only recommended at night. If this medication makes you drowsy throughout the day, no driving, drinking alcohol, or operating heavy machinery. You may also cut these tablets in half.  Apply ice or heat to the areas of discomfort. Do some gentle stretching throughout the day, especially during hot showers or baths. Take short frequent walks and avoid prolonged periods of sitting or laying. Expect to be sore for the next few day and follow up with primary care physician for recheck of ongoing symptoms but return to ER for emergent changing or worsening of symptoms such as severe headache that gets worse, altered mental status/behaving unusually, persistent vomiting, excessive drowsiness, numbness to the arms or legs, unsteady gait, or slurred speech.

## 2017-08-12 NOTE — ED Notes (Signed)
Called [facility] and spoke with Raven and informed her that patient is ready to be pick up.

## 2017-08-12 NOTE — ED Triage Notes (Signed)
[facility]. States he thinks he may have turned wrong while sleeping and injured his back

## 2017-08-12 NOTE — ED Notes (Signed)
ED Provider at bedside. 

## 2018-02-25 DEATH — deceased
